# Patient Record
Sex: Female | Born: 1995 | Hispanic: No | Marital: Single | Smoking: Never smoker
Health system: Southern US, Community
[De-identification: ages and names within clinical notes are randomized; demographics above are authoritative.]

## PROBLEM LIST (undated history)

## (undated) DIAGNOSIS — B20 Human immunodeficiency virus [HIV] disease: Secondary | ICD-10-CM

## (undated) DIAGNOSIS — Z21 Asymptomatic human immunodeficiency virus [HIV] infection status: Secondary | ICD-10-CM

## (undated) HISTORY — DX: Human immunodeficiency virus (HIV) disease: B20

## (undated) HISTORY — PX: WISDOM TOOTH EXTRACTION: SHX21

## (undated) HISTORY — DX: Asymptomatic human immunodeficiency virus (hiv) infection status: Z21

---

## 2014-05-18 ENCOUNTER — Telehealth: Payer: Self-pay

## 2014-05-18 NOTE — Telephone Encounter (Signed)
Patient referred through [facility].  Case Manager Vena AustriaEleanor with [employer].  I have not been able to contact Vena AustriaEleanor to schedule patient's visit.  I spoke with Laurel DimmerKim H., Social Worker with [facility] who schedule appointment and will try to contact Case Manager with appointment information.  Pt does not speak [place]. She speaks Swahili.   Laurell Josephsammy K King, RN

## 2014-05-20 ENCOUNTER — Other Ambulatory Visit: Payer: Self-pay | Admitting: Infectious Disease

## 2014-05-20 ENCOUNTER — Ambulatory Visit
Admission: RE | Admit: 2014-05-20 | Discharge: 2014-05-20 | Disposition: A | Discharge status: Home / Self Care | Payer: No Typology Code available for payment source | Source: Ambulatory Visit | Attending: Infectious Disease | Admitting: Infectious Disease

## 2014-05-20 ENCOUNTER — Other Ambulatory Visit (HOSPITAL_COMMUNITY)
Admission: RE | Admit: 2014-05-20 | Discharge: 2014-05-20 | Disposition: A | Discharge status: Home / Self Care | Payer: Medicaid Other | Source: Ambulatory Visit | Attending: Internal Medicine | Admitting: Internal Medicine

## 2014-05-20 ENCOUNTER — Ambulatory Visit (INDEPENDENT_AMBULATORY_CARE_PROVIDER_SITE_OTHER): Payer: Medicaid Other

## 2014-05-20 ENCOUNTER — Encounter: Payer: Self-pay | Admitting: *Deleted

## 2014-05-20 DIAGNOSIS — Z139 Encounter for screening, unspecified: Secondary | ICD-10-CM

## 2014-05-20 DIAGNOSIS — Z113 Encounter for screening for infections with a predominantly sexual mode of transmission: Secondary | ICD-10-CM | POA: Insufficient documentation

## 2014-05-20 DIAGNOSIS — B20 Human immunodeficiency virus [HIV] disease: Secondary | ICD-10-CM

## 2014-05-20 DIAGNOSIS — Z23 Encounter for immunization: Secondary | ICD-10-CM | POA: Diagnosis present

## 2014-05-20 DIAGNOSIS — Z91410 Personal history of adult physical and sexual abuse: Secondary | ICD-10-CM

## 2014-05-20 MED ORDER — EMTRICITAB-RILPIVIR-TENOFOV DF 200-25-300 MG PO TABS: 1.0000 | ORAL_TABLET | Freq: Every day | ORAL | Status: DC

## 2014-05-20 MED ORDER — SULFAMETHOXAZOLE-TRIMETHOPRIM 800-160 MG PO TABS: 1.0000 | ORAL_TABLET | Freq: Every day | ORAL | Status: DC

## 2014-05-21 LAB — COMPLETE METABOLIC PANEL WITH GFR
ALBUMIN: 3.6 g/dL (ref 3.5–5.2)
ALT: 39 U/L — ABNORMAL HIGH (ref 0–35)
AST: 66 U/L — ABNORMAL HIGH (ref 0–37)
Alkaline Phosphatase: 84 U/L (ref 39–117)
BUN: 9 mg/dL (ref 6–23)
CO2: 26 mEq/L (ref 19–32)
CREATININE: 0.57 mg/dL (ref 0.50–1.10)
Calcium: 8.9 mg/dL (ref 8.4–10.5)
Chloride: 103 mEq/L (ref 96–112)
GFR, Est Non African American: >89 mL/min
Glucose, Bld: 51 mg/dL — ABNORMAL LOW (ref 70–99)
POTASSIUM: 3.4 meq/L — AB (ref 3.5–5.3)
Sodium: 137 mEq/L (ref 135–145)
Total Bilirubin: 0.2 mg/dL (ref 0.2–1.2)
Total Protein: 8.3 g/dL (ref 6.0–8.3)

## 2014-05-21 LAB — CBC WITH DIFFERENTIAL/PLATELET
Basophils Absolute: 0 10*3/uL (ref 0.0–0.1)
Basophils Relative: 1 % (ref 0–1)
EOS ABS: 0.1 10*3/uL (ref 0.0–0.7)
Eosinophils Relative: 5 % (ref 0–5)
HEMATOCRIT: 33.1 % — AB (ref 36.0–46.0)
Hemoglobin: 10.6 g/dL — ABNORMAL LOW (ref 12.0–15.0)
LYMPHS PCT: 38 % (ref 12–46)
Lymphs Abs: 0.9 10*3/uL (ref 0.7–4.0)
MCH: 29.2 pg (ref 26.0–34.0)
MCHC: 32 g/dL (ref 30.0–36.0)
MCV: 91.2 fL (ref 78.0–100.0)
MONO ABS: 0.2 10*3/uL (ref 0.1–1.0)
MONOS PCT: 8 % (ref 3–12)
MPV: 11.2 fL (ref 8.6–12.4)
Neutro Abs: 1.1 10*3/uL — ABNORMAL LOW (ref 1.7–7.7)
Neutrophils Relative %: 48 % (ref 43–77)
Platelets: 247 10*3/uL (ref 150–400)
RBC: 3.63 MIL/uL — ABNORMAL LOW (ref 3.87–5.11)
RDW: 15.7 % — ABNORMAL HIGH (ref 11.5–15.5)
WBC: 2.3 10*3/uL — ABNORMAL LOW (ref 4.0–10.5)

## 2014-05-21 LAB — LIPID PANEL
CHOL/HDL RATIO: 4.1 ratio
CHOLESTEROL: 136 mg/dL (ref 0–200)
HDL: 33 mg/dL — ABNORMAL LOW (ref >=46)
LDL CALC: 78 mg/dL (ref 0–99)
Triglycerides: 126 mg/dL (ref <150)
VLDL: 25 mg/dL (ref 0–40)

## 2014-05-21 LAB — URINALYSIS
BILIRUBIN URINE: NEGATIVE
Glucose, UA: NEGATIVE mg/dL
KETONES UR: NEGATIVE mg/dL
Leukocytes, UA: NEGATIVE
Nitrite: NEGATIVE
Protein, ur: NEGATIVE mg/dL
Specific Gravity, Urine: 1.009 (ref 1.005–1.030)
Urobilinogen, UA: 0.2 mg/dL (ref 0.0–1.0)
pH: 6 (ref 5.0–8.0)

## 2014-05-21 LAB — HEPATITIS B SURFACE ANTIBODY,QUALITATIVE: Hep B S Ab: POSITIVE — AB

## 2014-05-21 LAB — HEPATITIS B SURFACE ANTIGEN: HEP B S AG: NEGATIVE

## 2014-05-21 LAB — T-HELPER CELL (CD4) - (RCID CLINIC ONLY)
CD4 % Helper T Cell: 18 % — ABNORMAL LOW (ref 33–55)
CD4 T Cell Abs: 160 /uL — ABNORMAL LOW (ref 400–2700)

## 2014-05-21 LAB — HEPATITIS C ANTIBODY: HCV AB: NEGATIVE

## 2014-05-21 LAB — RPR

## 2014-05-21 LAB — HEPATITIS A ANTIBODY, TOTAL: Hep A Total Ab: REACTIVE — AB

## 2014-05-21 LAB — HEPATITIS B CORE ANTIBODY, TOTAL: Hep B Core Total Ab: NONREACTIVE

## 2014-05-21 LAB — URINE CYTOLOGY ANCILLARY ONLY
Chlamydia: NEGATIVE
Neisseria Gonorrhea: NEGATIVE

## 2014-05-22 LAB — QUANTIFERON TB GOLD ASSAY (BLOOD)
INTERFERON GAMMA RELEASE ASSAY: NEGATIVE
Mitogen value: 1.46 IU/mL
QUANTIFERON TB AG MINUS NIL: 0 [IU]/mL
Quantiferon Nil Value: 0.16 IU/mL
TB Ag value: 0.15 IU/mL

## 2014-05-22 LAB — HIV-1 RNA QUANT-NO REFLEX-BLD
HIV 1 RNA Quant: 73039 copies/mL — ABNORMAL HIGH (ref <20)
HIV-1 RNA Quant, Log: 4.86 {Log} — ABNORMAL HIGH (ref <1.30)

## 2014-05-25 LAB — HLA B*5701: HLA-B*5701 w/rflx HLA-B High: NEGATIVE

## 2014-05-27 DIAGNOSIS — Z91410 Personal history of adult physical and sexual abuse: Secondary | ICD-10-CM | POA: Insufficient documentation
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
## 2014-06-11 ENCOUNTER — Encounter: Payer: Self-pay | Admitting: Internal Medicine

## 2014-06-11 ENCOUNTER — Ambulatory Visit (INDEPENDENT_AMBULATORY_CARE_PROVIDER_SITE_OTHER): Payer: Medicaid Other | Admitting: Internal Medicine

## 2014-06-11 VITALS — BP 105/70 | HR 82 | Temp 97.8°F | Wt 142.0 lb

## 2014-06-11 DIAGNOSIS — B20 Human immunodeficiency virus [HIV] disease: Secondary | ICD-10-CM | POA: Diagnosis not present

## 2014-06-11 DIAGNOSIS — R21 Rash and other nonspecific skin eruption: Secondary | ICD-10-CM | POA: Diagnosis not present

## 2014-06-11 MED ORDER — SULFAMETHOXAZOLE-TRIMETHOPRIM 800-160 MG PO TABS: 1.0000 | ORAL_TABLET | Freq: Every day | ORAL | Status: DC

## 2014-06-11 MED ORDER — EMTRICITAB-RILPIVIR-TENOFOV DF 200-25-300 MG PO TABS: 1.0000 | ORAL_TABLET | Freq: Every day | ORAL | Status: DC

## 2014-06-11 MED ORDER — CLOTRIMAZOLE 1 % EX CREA: 1.0000 "application " | TOPICAL_CREAM | Freq: Two times a day (BID) | CUTANEOUS | Status: DC
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
## 2014-06-13 LAB — HIV-1 RNA ULTRAQUANT REFLEX TO GENTYP+
HIV 1 RNA Quant: 165496 copies/mL — ABNORMAL HIGH (ref <20)
HIV-1 RNA Quant, Log: 5.22 {Log} — ABNORMAL HIGH (ref <1.30)

## 2014-06-23 LAB — HIV-1 GENOTYPR PLUS

## 2014-07-15 ENCOUNTER — Ambulatory Visit: Payer: Medicaid Other | Admitting: Internal Medicine

## 2014-07-16 ENCOUNTER — Ambulatory Visit (INDEPENDENT_AMBULATORY_CARE_PROVIDER_SITE_OTHER): Payer: Medicaid Other | Admitting: Internal Medicine

## 2014-07-16 VITALS — Temp 98.5°F | Wt 145.5 lb

## 2014-07-16 DIAGNOSIS — Z91199 Patient's noncompliance with other medical treatment and regimen due to unspecified reason: Secondary | ICD-10-CM

## 2014-07-16 DIAGNOSIS — Z9119 Patient's noncompliance with other medical treatment and regimen: Secondary | ICD-10-CM

## 2014-07-16 DIAGNOSIS — B20 Human immunodeficiency virus [HIV] disease: Secondary | ICD-10-CM | POA: Diagnosis present

## 2014-07-16 DIAGNOSIS — R110 Nausea: Secondary | ICD-10-CM | POA: Diagnosis not present

## 2014-07-16 DIAGNOSIS — R21 Rash and other nonspecific skin eruption: Secondary | ICD-10-CM | POA: Diagnosis not present

## 2014-07-16 MED ORDER — ONDANSETRON 4 MG PO TBDP: 4.0000 mg | ORAL_TABLET | Freq: Three times a day (TID) | ORAL | Status: DC | PRN
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
## 2014-07-17 LAB — HIV-1 RNA QUANT-NO REFLEX-BLD
HIV 1 RNA Quant: 2116 copies/mL — ABNORMAL HIGH (ref <20)
HIV-1 RNA Quant, Log: 3.33 {Log} — ABNORMAL HIGH (ref <1.30)

## 2014-07-17 LAB — T-HELPER CELL (CD4) - (RCID CLINIC ONLY)
CD4 T CELL HELPER: 19 % — AB (ref 33–55)
CD4 T Cell Abs: 230 /uL — ABNORMAL LOW (ref 400–2700)

## 2014-08-27 ENCOUNTER — Ambulatory Visit (INDEPENDENT_AMBULATORY_CARE_PROVIDER_SITE_OTHER): Payer: Medicaid Other | Admitting: Internal Medicine

## 2014-08-27 ENCOUNTER — Encounter: Payer: Self-pay | Admitting: Internal Medicine

## 2014-08-27 VITALS — BP 98/64 | HR 76 | Temp 98.4°F | Wt 142.0 lb

## 2014-08-27 DIAGNOSIS — B373 Candidiasis of vulva and vagina: Secondary | ICD-10-CM

## 2014-08-27 DIAGNOSIS — B86 Scabies: Secondary | ICD-10-CM

## 2014-08-27 DIAGNOSIS — B20 Human immunodeficiency virus [HIV] disease: Secondary | ICD-10-CM | POA: Diagnosis not present

## 2014-08-27 DIAGNOSIS — B3731 Acute candidiasis of vulva and vagina: Secondary | ICD-10-CM

## 2014-08-27 MED ORDER — FLUCONAZOLE 150 MG PO TABS: 150.0000 mg | ORAL_TABLET | Freq: Every day | ORAL | Status: DC

## 2014-08-27 MED ORDER — PERMETHRIN 5 % EX CREA: 1.0000 "application " | TOPICAL_CREAM | Freq: Once | CUTANEOUS | Status: DC
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
## 2014-09-10 ENCOUNTER — Other Ambulatory Visit: Payer: Medicaid Other

## 2014-09-10 DIAGNOSIS — B20 Human immunodeficiency virus [HIV] disease: Secondary | ICD-10-CM

## 2014-09-10 LAB — BASIC METABOLIC PANEL
BUN: 6 mg/dL (ref 6–23)
CHLORIDE: 106 meq/L (ref 96–112)
CO2: 25 mEq/L (ref 19–32)
CREATININE: 0.68 mg/dL (ref 0.50–1.10)
Calcium: 8.9 mg/dL (ref 8.4–10.5)
GLUCOSE: 75 mg/dL (ref 70–99)
Potassium: 4.2 mEq/L (ref 3.5–5.3)
Sodium: 139 mEq/L (ref 135–145)

## 2014-09-11 LAB — CBC WITH DIFFERENTIAL/PLATELET
Basophils Absolute: 0 10*3/uL (ref 0.0–0.1)
Basophils Relative: 0 % (ref 0–1)
Eosinophils Absolute: 0.3 10*3/uL (ref 0.0–0.7)
Eosinophils Relative: 10 % — ABNORMAL HIGH (ref 0–5)
HEMATOCRIT: 32.6 % — AB (ref 36.0–46.0)
Hemoglobin: 10.4 g/dL — ABNORMAL LOW (ref 12.0–15.0)
Lymphocytes Relative: 58 % — ABNORMAL HIGH (ref 12–46)
Lymphs Abs: 1.5 10*3/uL (ref 0.7–4.0)
MCH: 29.1 pg (ref 26.0–34.0)
MCHC: 31.9 g/dL (ref 30.0–36.0)
MCV: 91.3 fL (ref 78.0–100.0)
MONOS PCT: 9 % (ref 3–12)
MPV: 10.8 fL (ref 8.6–12.4)
Monocytes Absolute: 0.2 10*3/uL (ref 0.1–1.0)
Neutro Abs: 0.6 10*3/uL — ABNORMAL LOW (ref 1.7–7.7)
Neutrophils Relative %: 23 % — ABNORMAL LOW (ref 43–77)
Platelets: 321 10*3/uL (ref 150–400)
RBC: 3.57 MIL/uL — ABNORMAL LOW (ref 3.87–5.11)
RDW: 14.7 % (ref 11.5–15.5)
WBC: 2.6 10*3/uL — ABNORMAL LOW (ref 4.0–10.5)

## 2014-09-11 LAB — HIV-1 RNA QUANT-NO REFLEX-BLD
HIV 1 RNA QUANT: 169 {copies}/mL — AB (ref <20)
HIV-1 RNA QUANT, LOG: 2.23 {Log} — AB (ref <1.30)

## 2014-09-11 LAB — PATHOLOGIST SMEAR REVIEW

## 2014-10-13 ENCOUNTER — Other Ambulatory Visit: Payer: Medicaid Other

## 2014-11-03 ENCOUNTER — Encounter: Payer: Self-pay | Admitting: Internal Medicine

## 2014-11-03 ENCOUNTER — Ambulatory Visit (INDEPENDENT_AMBULATORY_CARE_PROVIDER_SITE_OTHER): Payer: Medicaid Other | Admitting: Internal Medicine

## 2014-11-03 VITALS — BP 111/74 | HR 75 | Temp 98.2°F | Ht 64.0 in | Wt 146.0 lb

## 2014-11-03 DIAGNOSIS — Z23 Encounter for immunization: Secondary | ICD-10-CM | POA: Diagnosis present

## 2014-11-03 DIAGNOSIS — B354 Tinea corporis: Secondary | ICD-10-CM | POA: Diagnosis not present

## 2014-11-03 DIAGNOSIS — B20 Human immunodeficiency virus [HIV] disease: Secondary | ICD-10-CM | POA: Diagnosis not present

## 2014-11-03 MED ORDER — MICONAZOLE NITRATE 2 % EX CREA: 1.0000 "application " | TOPICAL_CREAM | Freq: Two times a day (BID) | CUTANEOUS | Status: DC
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
## 2014-11-04 LAB — HIV-1 RNA QUANT-NO REFLEX-BLD
HIV 1 RNA Quant: 47 copies/mL — ABNORMAL HIGH (ref <20)
HIV-1 RNA Quant, Log: 1.67 {Log} — ABNORMAL HIGH (ref <1.30)

## 2014-11-05 LAB — T-HELPER CELL (CD4) - (RCID CLINIC ONLY)
CD4 T CELL ABS: 410 /uL (ref 400–2700)
CD4 T CELL HELPER: 23 % — AB (ref 33–55)

## 2014-11-17 ENCOUNTER — Other Ambulatory Visit: Payer: Self-pay | Admitting: Internal Medicine

## 2014-11-20 ENCOUNTER — Telehealth: Payer: Self-pay | Admitting: *Deleted

## 2014-11-20 NOTE — Telephone Encounter (Signed)
If she feels that it worked for her, can give her refill. It is tx for scabies, i would review with her how to put it on at night, keep on 8hrs, wash off in the morning. Important that she does hot washing of sheets, vacuum of furniture, between cushions since i wonder if she is getting reinfected

## 2014-11-20 NOTE — Telephone Encounter (Signed)
Pharmacy request for permethrin 5% cream. Last refilled in June 2016. Unsure of payer source, is this ok to refill? Wendall Mola

## 2014-11-23 ENCOUNTER — Other Ambulatory Visit: Payer: Self-pay | Admitting: *Deleted

## 2014-11-23 DIAGNOSIS — B20 Human immunodeficiency virus [HIV] disease: Secondary | ICD-10-CM

## 2014-11-23 MED ORDER — EMTRICITAB-RILPIVIR-TENOFOV DF 200-25-300 MG PO TABS: 1.0000 | ORAL_TABLET | Freq: Every day | ORAL | Status: DC

## 2014-11-24 ENCOUNTER — Other Ambulatory Visit: Payer: Self-pay | Admitting: *Deleted

## 2014-11-24 DIAGNOSIS — B86 Scabies: Secondary | ICD-10-CM

## 2014-11-24 MED ORDER — PERMETHRIN 5 % EX CREA: 1.0000 "application " | TOPICAL_CREAM | Freq: Once | CUTANEOUS | Status: DC

## 2014-11-24 NOTE — Telephone Encounter (Signed)
Triage nurse please take care of this. Thank you. Jacqueline Cockerham  

## 2015-01-04 DIAGNOSIS — Z23 Encounter for immunization: Secondary | ICD-10-CM

## 2015-01-04 NOTE — Congregational Nurse Program (Signed)
[facility].  She wants to get flu vaccine.  Has no insurance, or medicaid.  Client [place].  She signed up for flu vaccine to be given on Thursday.  Will f/u with client on Thursday to give flu vaccine

## 2015-01-19 ENCOUNTER — Other Ambulatory Visit: Payer: Self-pay | Admitting: *Deleted

## 2015-01-19 DIAGNOSIS — B20 Human immunodeficiency virus [HIV] disease: Secondary | ICD-10-CM

## 2015-01-19 MED ORDER — EMTRICITAB-RILPIVIR-TENOFOV DF 200-25-300 MG PO TABS: 1.0000 | ORAL_TABLET | Freq: Every day | ORAL | Status: DC

## 2015-02-11 ENCOUNTER — Ambulatory Visit: Payer: Medicaid Other | Admitting: Internal Medicine

## 2015-05-10 ENCOUNTER — Other Ambulatory Visit (INDEPENDENT_AMBULATORY_CARE_PROVIDER_SITE_OTHER): Payer: Self-pay

## 2015-05-10 DIAGNOSIS — Z113 Encounter for screening for infections with a predominantly sexual mode of transmission: Secondary | ICD-10-CM

## 2015-05-10 DIAGNOSIS — B20 Human immunodeficiency virus [HIV] disease: Secondary | ICD-10-CM

## 2015-05-10 DIAGNOSIS — Z79899 Other long term (current) drug therapy: Secondary | ICD-10-CM

## 2015-05-10 LAB — LIPID PANEL
CHOL/HDL RATIO: 4.9 ratio (ref <=5.0)
Cholesterol: 133 mg/dL (ref 125–200)
HDL: 27 mg/dL — AB (ref >=46)
LDL CALC: 76 mg/dL (ref <130)
TRIGLYCERIDES: 151 mg/dL — AB (ref <150)
VLDL: 30 mg/dL (ref <30)

## 2015-05-10 LAB — CBC WITH DIFFERENTIAL/PLATELET
BASOS PCT: 1 % (ref 0–1)
Basophils Absolute: 0 10*3/uL (ref 0.0–0.1)
EOS ABS: 0.1 10*3/uL (ref 0.0–0.7)
EOS PCT: 4 % (ref 0–5)
HCT: 33.5 % — ABNORMAL LOW (ref 36.0–46.0)
Hemoglobin: 10.8 g/dL — ABNORMAL LOW (ref 12.0–15.0)
LYMPHS ABS: 1.5 10*3/uL (ref 0.7–4.0)
Lymphocytes Relative: 41 % (ref 12–46)
MCH: 29.3 pg (ref 26.0–34.0)
MCHC: 32.2 g/dL (ref 30.0–36.0)
MCV: 90.8 fL (ref 78.0–100.0)
MONO ABS: 0.3 10*3/uL (ref 0.1–1.0)
MONOS PCT: 8 % (ref 3–12)
MPV: 10.6 fL (ref 8.6–12.4)
Neutro Abs: 1.7 10*3/uL (ref 1.7–7.7)
Neutrophils Relative %: 46 % (ref 43–77)
PLATELETS: 390 10*3/uL (ref 150–400)
RBC: 3.69 MIL/uL — ABNORMAL LOW (ref 3.87–5.11)
RDW: 13.6 % (ref 11.5–15.5)
WBC: 3.6 10*3/uL — ABNORMAL LOW (ref 4.0–10.5)

## 2015-05-10 LAB — COMPLETE METABOLIC PANEL WITH GFR
ALK PHOS: 113 U/L (ref 33–115)
ALT: 25 U/L (ref 6–29)
AST: 34 U/L — ABNORMAL HIGH (ref 10–30)
Albumin: 3.9 g/dL (ref 3.6–5.1)
BILIRUBIN TOTAL: 0.3 mg/dL (ref 0.2–1.2)
BUN: 6 mg/dL — AB (ref 7–25)
CALCIUM: 9.5 mg/dL (ref 8.6–10.2)
CHLORIDE: 104 mmol/L (ref 98–110)
CO2: 23 mmol/L (ref 20–31)
CREATININE: 0.65 mg/dL (ref 0.50–1.10)
Glucose, Bld: 69 mg/dL (ref 65–99)
Potassium: 4.5 mmol/L (ref 3.5–5.3)
Sodium: 138 mmol/L (ref 135–146)
Total Protein: 7.8 g/dL (ref 6.1–8.1)

## 2015-05-11 LAB — RPR

## 2015-05-11 LAB — HIV-1 RNA QUANT-NO REFLEX-BLD
HIV 1 RNA QUANT: 168 {copies}/mL — AB (ref <20)
HIV-1 RNA QUANT, LOG: 2.23 {Log_copies}/mL — AB (ref <1.30)

## 2015-05-11 LAB — T-HELPER CELL (CD4) - (RCID CLINIC ONLY)
CD4 % Helper T Cell: 28 % — ABNORMAL LOW (ref 33–55)
CD4 T Cell Abs: 450 /uL (ref 400–2700)

## 2015-05-11 LAB — URINE CYTOLOGY ANCILLARY ONLY
Chlamydia: NEGATIVE
Neisseria Gonorrhea: NEGATIVE

## 2015-06-01 ENCOUNTER — Encounter: Payer: Self-pay | Admitting: Internal Medicine

## 2015-06-01 ENCOUNTER — Telehealth: Payer: Self-pay

## 2015-06-01 ENCOUNTER — Ambulatory Visit (INDEPENDENT_AMBULATORY_CARE_PROVIDER_SITE_OTHER): Payer: Self-pay | Admitting: Internal Medicine

## 2015-06-01 VITALS — BP 100/69 | HR 83 | Temp 98.1°F | Wt 149.0 lb

## 2015-06-01 DIAGNOSIS — B20 Human immunodeficiency virus [HIV] disease: Secondary | ICD-10-CM

## 2015-06-01 MED ORDER — ELVITEG-COBIC-EMTRICIT-TENOFAF 150-150-200-10 MG PO TABS: 1.0000 | ORAL_TABLET | Freq: Every day | ORAL | Status: DC

## 2015-06-01 NOTE — Telephone Encounter (Signed)
[facility] and notified her that Dr. Drue SecondSnider wants the Complera discontinued. Tammy stated that she would take care of it.
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
## 2015-07-04 IMAGING — CR DG CHEST 1V
1 series · 1 of 1 positions shown · non-contrast
Comparison: None

CLINICAL DATA: Class B refugee, screening

EXAM:
CHEST  1 VIEW

[w chest pa]
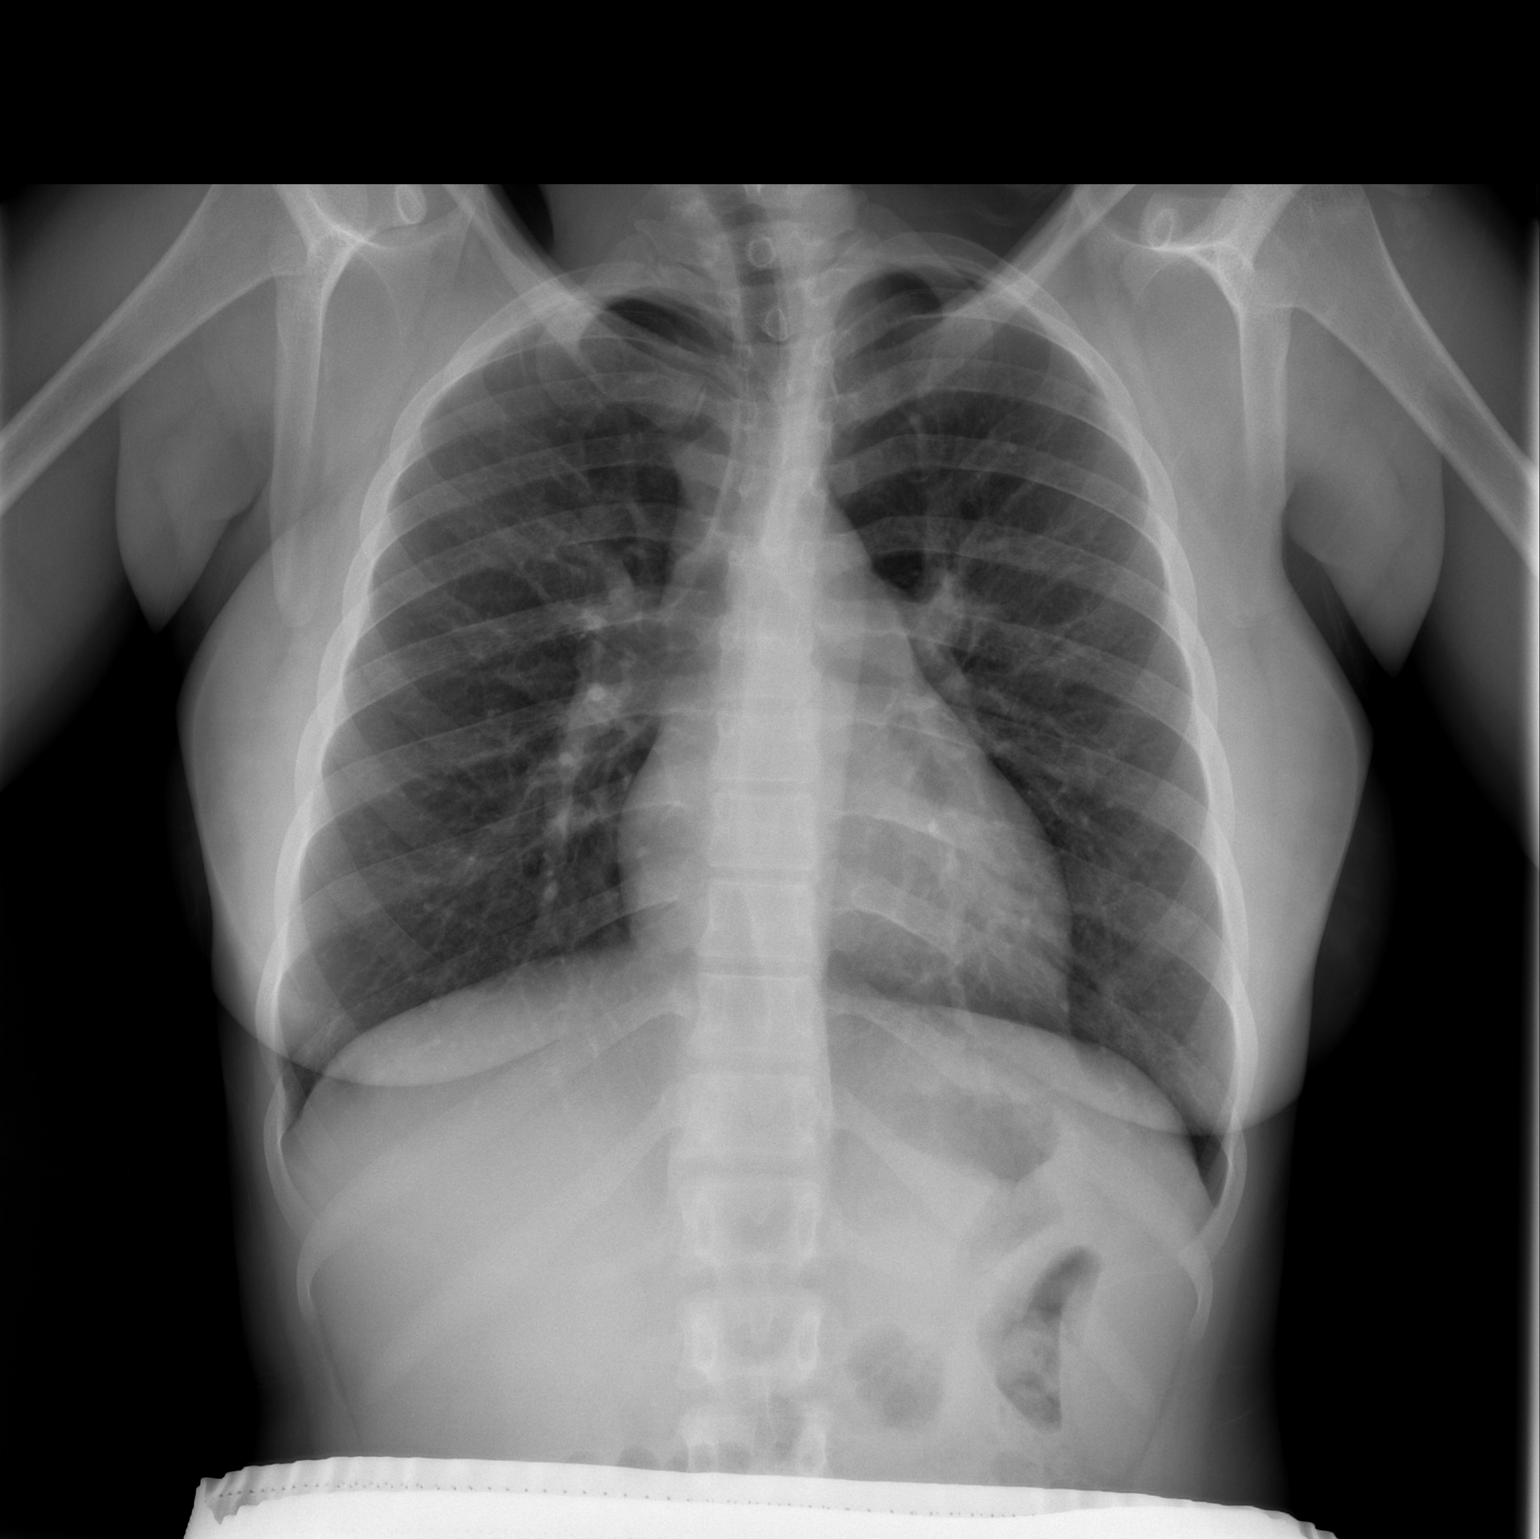

[1 of 1 positions shown; findings below may reference images not displayed]

FINDINGS: Normal heart size, mediastinal contours, and pulmonary vascularity.

Lungs clear.

No pneumothorax.

Bones unremarkable.
IMPRESSION: Normal exam.

## 2015-07-20 ENCOUNTER — Ambulatory Visit: Payer: Self-pay | Admitting: Internal Medicine

## 2015-09-23 ENCOUNTER — Ambulatory Visit (INDEPENDENT_AMBULATORY_CARE_PROVIDER_SITE_OTHER): Payer: BLUE CROSS/BLUE SHIELD | Admitting: Internal Medicine

## 2015-09-23 ENCOUNTER — Ambulatory Visit: Payer: Self-pay | Admitting: *Deleted

## 2015-09-23 ENCOUNTER — Encounter: Payer: Self-pay | Admitting: Internal Medicine

## 2015-09-23 VITALS — BP 111/74 | HR 75 | Temp 97.4°F | Wt 152.0 lb

## 2015-09-23 DIAGNOSIS — Z113 Encounter for screening for infections with a predominantly sexual mode of transmission: Secondary | ICD-10-CM | POA: Diagnosis not present

## 2015-09-23 DIAGNOSIS — Z124 Encounter for screening for malignant neoplasm of cervix: Secondary | ICD-10-CM

## 2015-09-23 DIAGNOSIS — H6123 Impacted cerumen, bilateral: Secondary | ICD-10-CM | POA: Diagnosis not present

## 2015-09-23 DIAGNOSIS — N898 Other specified noninflammatory disorders of vagina: Secondary | ICD-10-CM

## 2015-09-23 DIAGNOSIS — B20 Human immunodeficiency virus [HIV] disease: Secondary | ICD-10-CM | POA: Diagnosis not present

## 2015-09-23 DIAGNOSIS — Z91410 Personal history of adult physical and sexual abuse: Secondary | ICD-10-CM

## 2015-09-23 LAB — CBC WITH DIFFERENTIAL/PLATELET
Basophils Absolute: 0 cells/uL (ref 0–200)
Basophils Relative: 0 %
EOS ABS: 217 {cells}/uL (ref 15–500)
Eosinophils Relative: 7 %
HCT: 31.9 % — ABNORMAL LOW (ref 35.0–45.0)
Hemoglobin: 10.3 g/dL — ABNORMAL LOW (ref 11.7–15.5)
LYMPHS ABS: 1798 {cells}/uL (ref 850–3900)
LYMPHS PCT: 58 %
MCH: 29.9 pg (ref 27.0–33.0)
MCHC: 32.3 g/dL (ref 32.0–36.0)
MCV: 92.7 fL (ref 80.0–100.0)
MONOS PCT: 12 %
MPV: 11.2 fL (ref 7.5–12.5)
Monocytes Absolute: 372 cells/uL (ref 200–950)
Neutro Abs: 713 cells/uL — ABNORMAL LOW (ref 1500–7800)
Neutrophils Relative %: 23 %
PLATELETS: 246 10*3/uL (ref 140–400)
RBC: 3.44 MIL/uL — AB (ref 3.80–5.10)
RDW: 15 % (ref 11.0–15.0)
WBC: 3.1 10*3/uL — AB (ref 3.8–10.8)

## 2015-09-23 LAB — BASIC METABOLIC PANEL
BUN: 7 mg/dL (ref 7–25)
CHLORIDE: 107 mmol/L (ref 98–110)
CO2: 22 mmol/L (ref 20–31)
CREATININE: 0.75 mg/dL (ref 0.50–1.10)
Calcium: 8.6 mg/dL (ref 8.6–10.2)
Glucose, Bld: 74 mg/dL (ref 65–99)
Potassium: 4.1 mmol/L (ref 3.5–5.3)
Sodium: 137 mmol/L (ref 135–146)

## 2015-09-23 MED ORDER — FLUCONAZOLE 150 MG PO TABS: 150.0000 mg | ORAL_TABLET | Freq: Every day | ORAL | Status: DC

## 2015-09-23 NOTE — BH Specialist Note (Signed)
Counselor met with Sheena Wallace in the exam room for a warm hand off.  Patient was oriented times four with good affect and dress.  Patient was alert and talkative.  Patient was soft spoken but most honest about what was going on in her life that was causing her grief.  Counselor made necessary introductions and shared with her that counseling is available if she needs it.  Counselor provided support and encouragement for patient by reassuring her that processing it was important to resolving those very  Issues.  Patient indicated that she would like to set up an appointment.  Counselor explained that she could make an appointment when she checked out today.   [name], MA, LPC [facility]
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
## 2015-09-24 LAB — PATHOLOGIST SMEAR REVIEW

## 2015-09-24 LAB — CERVICOVAGINAL ANCILLARY ONLY
CHLAMYDIA, DNA PROBE: NEGATIVE
NEISSERIA GONORRHEA: NEGATIVE
TRICH (WINDOWPATH): NEGATIVE

## 2015-09-24 LAB — CYTOLOGY - PAP

## 2015-09-24 LAB — T-HELPER CELL (CD4) - (RCID CLINIC ONLY)
CD4 % Helper T Cell: 25 % — ABNORMAL LOW (ref 33–55)
CD4 T Cell Abs: 470 /uL (ref 400–2700)

## 2015-09-27 LAB — HIV-1 RNA QUANT-NO REFLEX-BLD: HIV 1 RNA Quant: <20 copies/mL (ref <20)

## 2015-09-27 LAB — CERVICOVAGINAL ANCILLARY ONLY
BACTERIAL VAGINITIS: NEGATIVE
CANDIDA VAGINITIS: POSITIVE — AB

## 2015-10-25 ENCOUNTER — Ambulatory Visit: Payer: Self-pay | Admitting: *Deleted

## 2015-12-03 ENCOUNTER — Encounter: Payer: Self-pay | Admitting: *Deleted

## 2016-01-06 ENCOUNTER — Ambulatory Visit (INDEPENDENT_AMBULATORY_CARE_PROVIDER_SITE_OTHER): Payer: BLUE CROSS/BLUE SHIELD | Admitting: Internal Medicine

## 2016-01-06 ENCOUNTER — Encounter: Payer: Self-pay | Admitting: Internal Medicine

## 2016-01-06 VITALS — BP 105/69 | HR 79 | Temp 97.5°F | Wt 167.0 lb

## 2016-01-06 DIAGNOSIS — B20 Human immunodeficiency virus [HIV] disease: Secondary | ICD-10-CM | POA: Diagnosis not present

## 2016-01-06 DIAGNOSIS — B9789 Other viral agents as the cause of diseases classified elsewhere: Secondary | ICD-10-CM | POA: Diagnosis not present

## 2016-01-06 DIAGNOSIS — J069 Acute upper respiratory infection, unspecified: Secondary | ICD-10-CM | POA: Diagnosis not present
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
## 2016-03-28 ENCOUNTER — Encounter: Payer: Self-pay | Admitting: Internal Medicine

## 2016-03-28 ENCOUNTER — Telehealth: Payer: Self-pay | Admitting: *Deleted

## 2016-03-28 ENCOUNTER — Ambulatory Visit (INDEPENDENT_AMBULATORY_CARE_PROVIDER_SITE_OTHER): Payer: BLUE CROSS/BLUE SHIELD | Admitting: Internal Medicine

## 2016-03-28 VITALS — BP 104/68 | HR 76 | Temp 98.5°F | Ht 65.0 in | Wt 156.4 lb

## 2016-03-28 DIAGNOSIS — R300 Dysuria: Secondary | ICD-10-CM

## 2016-03-28 DIAGNOSIS — Z23 Encounter for immunization: Secondary | ICD-10-CM

## 2016-03-28 DIAGNOSIS — B20 Human immunodeficiency virus [HIV] disease: Secondary | ICD-10-CM

## 2016-03-28 LAB — COMPLETE METABOLIC PANEL WITH GFR
ALT: 7 U/L (ref 6–29)
AST: 17 U/L (ref 10–30)
Albumin: 4.1 g/dL (ref 3.6–5.1)
Alkaline Phosphatase: 97 U/L (ref 33–115)
BILIRUBIN TOTAL: 0.4 mg/dL (ref 0.2–1.2)
BUN: 8 mg/dL (ref 7–25)
CALCIUM: 9.3 mg/dL (ref 8.6–10.2)
CHLORIDE: 104 mmol/L (ref 98–110)
CO2: 23 mmol/L (ref 20–31)
CREATININE: 0.77 mg/dL (ref 0.50–1.10)
GFR, Est Non African American: >89 mL/min (ref >=60)
Glucose, Bld: 78 mg/dL (ref 65–99)
Potassium: 4 mmol/L (ref 3.5–5.3)
Sodium: 137 mmol/L (ref 135–146)
TOTAL PROTEIN: 7.8 g/dL (ref 6.1–8.1)

## 2016-03-28 LAB — CBC WITH DIFFERENTIAL/PLATELET
BASOS PCT: 1 %
Basophils Absolute: 29 cells/uL (ref 0–200)
EOS ABS: 290 {cells}/uL (ref 15–500)
Eosinophils Relative: 10 %
HEMATOCRIT: 37.1 % (ref 35.0–45.0)
HEMOGLOBIN: 12 g/dL (ref 11.7–15.5)
LYMPHS ABS: 1798 {cells}/uL (ref 850–3900)
LYMPHS PCT: 62 %
MCH: 31.5 pg (ref 27.0–33.0)
MCHC: 32.3 g/dL (ref 32.0–36.0)
MCV: 97.4 fL (ref 80.0–100.0)
MONO ABS: 232 {cells}/uL (ref 200–950)
MPV: 11.8 fL (ref 7.5–12.5)
Monocytes Relative: 8 %
Neutro Abs: 551 cells/uL — ABNORMAL LOW (ref 1500–7800)
Neutrophils Relative %: 19 %
Platelets: 259 10*3/uL (ref 140–400)
RBC: 3.81 MIL/uL (ref 3.80–5.10)
RDW: 13.7 % (ref 11.0–15.0)
WBC: 2.9 10*3/uL — AB (ref 3.8–10.8)
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
## 2016-03-28 NOTE — Telephone Encounter (Signed)
Called the patient to advise she left her wallet at the clinic.

## 2016-03-29 LAB — URINALYSIS, ROUTINE W REFLEX MICROSCOPIC
BILIRUBIN URINE: NEGATIVE
Glucose, UA: NEGATIVE
Hgb urine dipstick: NEGATIVE
Leukocytes, UA: NEGATIVE
Nitrite: NEGATIVE
Protein, ur: NEGATIVE
SPECIFIC GRAVITY, URINE: 1.025 (ref 1.001–1.035)
pH: 5.5 (ref 5.0–8.0)

## 2016-03-29 LAB — T-HELPER CELL (CD4) - (RCID CLINIC ONLY)
CD4 % Helper T Cell: 28 % — ABNORMAL LOW (ref 33–55)
CD4 T Cell Abs: 520 /uL (ref 400–2700)

## 2016-03-29 LAB — RPR

## 2016-03-30 LAB — CULTURE, URINE COMPREHENSIVE: ORGANISM ID, BACTERIA: NO GROWTH

## 2016-03-31 LAB — HIV-1 RNA QUANT-NO REFLEX-BLD
HIV 1 RNA QUANT: 23 {copies}/mL — AB (ref <20)
HIV-1 RNA Quant, Log: 1.36 Log copies/mL — ABNORMAL HIGH (ref <1.30)

## 2016-04-03 ENCOUNTER — Ambulatory Visit: Payer: BLUE CROSS/BLUE SHIELD

## 2016-04-18 ENCOUNTER — Encounter: Payer: Self-pay | Admitting: Internal Medicine

## 2016-04-18 ENCOUNTER — Ambulatory Visit (INDEPENDENT_AMBULATORY_CARE_PROVIDER_SITE_OTHER): Payer: BLUE CROSS/BLUE SHIELD | Admitting: Internal Medicine

## 2016-04-18 VITALS — BP 100/63 | HR 79 | Temp 98.4°F | Ht 65.0 in | Wt 155.0 lb

## 2016-04-18 DIAGNOSIS — B20 Human immunodeficiency virus [HIV] disease: Secondary | ICD-10-CM | POA: Diagnosis not present

## 2016-04-18 DIAGNOSIS — B308 Other viral conjunctivitis: Secondary | ICD-10-CM

## 2016-04-18 DIAGNOSIS — N3944 Nocturnal enuresis: Secondary | ICD-10-CM

## 2016-04-18 MED ORDER — IMIPRAMINE HCL 10 MG PO TABS: 10.0000 mg | ORAL_TABLET | Freq: Every day | ORAL | 3 refills | Status: DC

## 2016-04-18 MED ORDER — FLUCONAZOLE 150 MG PO TABS
150.0000 mg | ORAL_TABLET | ORAL | 0 refills | Status: DC
Start: 2016-04-18 — End: 2017-05-17
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
## 2016-04-18 NOTE — Patient Instructions (Signed)
   Go to pharmacy, and ask for lubricating eye drops. For itchy eyes

## 2016-05-25 ENCOUNTER — Ambulatory Visit: Payer: BLUE CROSS/BLUE SHIELD | Admitting: Internal Medicine

## 2016-06-16 ENCOUNTER — Other Ambulatory Visit: Payer: Self-pay | Admitting: Internal Medicine

## 2016-06-16 DIAGNOSIS — B20 Human immunodeficiency virus [HIV] disease: Secondary | ICD-10-CM

## 2016-08-17 ENCOUNTER — Encounter: Payer: Self-pay | Admitting: Internal Medicine

## 2016-09-21 ENCOUNTER — Emergency Department (HOSPITAL_COMMUNITY)
Admission: EM | Admit: 2016-09-21 | Discharge: 2016-09-21 | Disposition: A | Discharge status: Home / Self Care | Payer: BLUE CROSS/BLUE SHIELD | Attending: Emergency Medicine | Admitting: Emergency Medicine

## 2016-09-21 ENCOUNTER — Encounter (HOSPITAL_COMMUNITY): Payer: Self-pay

## 2016-09-21 DIAGNOSIS — R2242 Localized swelling, mass and lump, left lower limb: Secondary | ICD-10-CM | POA: Diagnosis present

## 2016-09-21 DIAGNOSIS — L02416 Cutaneous abscess of left lower limb: Secondary | ICD-10-CM | POA: Diagnosis not present

## 2016-09-21 DIAGNOSIS — Z79899 Other long term (current) drug therapy: Secondary | ICD-10-CM | POA: Diagnosis not present

## 2016-09-21 DIAGNOSIS — Z21 Asymptomatic human immunodeficiency virus [HIV] infection status: Secondary | ICD-10-CM | POA: Diagnosis not present

## 2016-09-21 DIAGNOSIS — L0291 Cutaneous abscess, unspecified: Secondary | ICD-10-CM

## 2016-09-21 MED ORDER — IBUPROFEN 400 MG PO TABS
600.0000 mg | ORAL_TABLET | Freq: Once | ORAL | Status: AC
  Administered 2016-09-21: 19:00:00 600 mg via ORAL
  Filled 2016-09-21: qty 1

## 2016-09-21 MED ORDER — SULFAMETHOXAZOLE-TRIMETHOPRIM 800-160 MG PO TABS: 1.0000 | ORAL_TABLET | Freq: Two times a day (BID) | ORAL | 0 refills | Status: AC

## 2016-09-21 MED ORDER — SULFAMETHOXAZOLE-TRIMETHOPRIM 800-160 MG PO TABS
1.0000 | ORAL_TABLET | Freq: Once | ORAL | Status: AC
  Administered 2016-09-21: 1 via ORAL
  Filled 2016-09-21: qty 1

## 2016-09-21 MED ORDER — LIDOCAINE HCL 2 % IJ SOLN
20.0000 mL | Freq: Once | INTRAMUSCULAR | Status: AC
  Administered 2016-09-21: 400 mg
  Filled 2016-09-21: qty 20

## 2016-09-21 NOTE — ED Notes (Signed)
Left anterior thigh with red, indurated area draining serous fluid, site warm and erythematous.
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
## 2016-09-21 NOTE — Discharge Instructions (Signed)
Please read attached information. If you experience any new or worsening signs or symptoms please return to the emergency room for evaluation. Please follow-up with your primary care provider or specialist as discussed. Please use medication prescribed only as directed and discontinue taking if you have any concerning signs or symptoms.   °

## 2016-09-21 NOTE — ED Triage Notes (Signed)
Pt noticed itchy bump to left thigh Friday. Since then it has become increasingly more painful, red, and swollen. Infected area on thigh has some serous drainage in triage and appears cellulitic.

## 2016-10-26 ENCOUNTER — Ambulatory Visit: Payer: BLUE CROSS/BLUE SHIELD | Admitting: Internal Medicine

## 2016-10-30 ENCOUNTER — Encounter (HOSPITAL_COMMUNITY): Payer: Self-pay | Admitting: *Deleted

## 2016-10-30 DIAGNOSIS — Y939 Activity, unspecified: Secondary | ICD-10-CM | POA: Insufficient documentation

## 2016-10-30 DIAGNOSIS — B20 Human immunodeficiency virus [HIV] disease: Secondary | ICD-10-CM | POA: Insufficient documentation

## 2016-10-30 DIAGNOSIS — Z79899 Other long term (current) drug therapy: Secondary | ICD-10-CM | POA: Diagnosis not present

## 2016-10-30 DIAGNOSIS — W4904XA Ring or other jewelry causing external constriction, initial encounter: Secondary | ICD-10-CM | POA: Diagnosis not present

## 2016-10-30 DIAGNOSIS — S60444A External constriction of right ring finger, initial encounter: Secondary | ICD-10-CM | POA: Diagnosis not present

## 2016-10-30 DIAGNOSIS — Y929 Unspecified place or not applicable: Secondary | ICD-10-CM | POA: Diagnosis not present

## 2016-10-30 DIAGNOSIS — Y999 Unspecified external cause status: Secondary | ICD-10-CM | POA: Diagnosis not present

## 2016-10-30 NOTE — ED Triage Notes (Signed)
Pt says she put a ring on her right hand fourth finger today and now unable to get it off. She has used soap/lotion without relief.

## 2016-10-31 ENCOUNTER — Emergency Department (HOSPITAL_COMMUNITY)
Admission: EM | Admit: 2016-10-31 | Discharge: 2016-10-31 | Disposition: A | Discharge status: Home / Self Care | Payer: BLUE CROSS/BLUE SHIELD | Attending: Emergency Medicine | Admitting: Emergency Medicine

## 2016-10-31 DIAGNOSIS — S60449A External constriction of unspecified finger, initial encounter: Secondary | ICD-10-CM

## 2016-10-31 DIAGNOSIS — W4904XA Ring or other jewelry causing external constriction, initial encounter: Secondary | ICD-10-CM
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
## 2017-01-13 ENCOUNTER — Other Ambulatory Visit: Payer: Self-pay | Admitting: Internal Medicine

## 2017-01-13 DIAGNOSIS — B20 Human immunodeficiency virus [HIV] disease: Secondary | ICD-10-CM

## 2017-02-17 ENCOUNTER — Other Ambulatory Visit: Payer: Self-pay | Admitting: Internal Medicine

## 2017-02-17 DIAGNOSIS — B20 Human immunodeficiency virus [HIV] disease: Secondary | ICD-10-CM

## 2017-03-24 ENCOUNTER — Other Ambulatory Visit: Payer: Self-pay | Admitting: Internal Medicine

## 2017-03-24 DIAGNOSIS — B20 Human immunodeficiency virus [HIV] disease: Secondary | ICD-10-CM

## 2017-03-29 ENCOUNTER — Other Ambulatory Visit: Payer: Self-pay | Admitting: Internal Medicine

## 2017-03-29 DIAGNOSIS — B20 Human immunodeficiency virus [HIV] disease: Secondary | ICD-10-CM

## 2017-04-02 ENCOUNTER — Ambulatory Visit: Payer: BLUE CROSS/BLUE SHIELD

## 2017-04-26 ENCOUNTER — Other Ambulatory Visit: Payer: Self-pay | Admitting: *Deleted

## 2017-04-26 DIAGNOSIS — Z113 Encounter for screening for infections with a predominantly sexual mode of transmission: Secondary | ICD-10-CM

## 2017-04-26 DIAGNOSIS — B20 Human immunodeficiency virus [HIV] disease: Secondary | ICD-10-CM

## 2017-04-26 NOTE — Progress Notes (Signed)
hiv

## 2017-05-03 ENCOUNTER — Other Ambulatory Visit (HOSPITAL_COMMUNITY)
Admission: RE | Admit: 2017-05-03 | Discharge: 2017-05-03 | Disposition: A | Discharge status: Home / Self Care | Payer: BLUE CROSS/BLUE SHIELD | Source: Ambulatory Visit | Attending: Internal Medicine | Admitting: Internal Medicine

## 2017-05-03 ENCOUNTER — Other Ambulatory Visit: Payer: BLUE CROSS/BLUE SHIELD

## 2017-05-03 DIAGNOSIS — Z113 Encounter for screening for infections with a predominantly sexual mode of transmission: Secondary | ICD-10-CM

## 2017-05-03 DIAGNOSIS — B20 Human immunodeficiency virus [HIV] disease: Secondary | ICD-10-CM

## 2017-05-04 LAB — CBC WITH DIFFERENTIAL/PLATELET
Basophils Absolute: 32 cells/uL (ref 0–200)
Basophils Relative: 0.9 %
Eosinophils Absolute: 130 cells/uL (ref 15–500)
Eosinophils Relative: 3.7 %
HCT: 34.6 % — ABNORMAL LOW (ref 35.0–45.0)
Hemoglobin: 11.8 g/dL (ref 11.7–15.5)
Lymphs Abs: 2209 cells/uL (ref 850–3900)
MCH: 32.7 pg (ref 27.0–33.0)
MCHC: 34.1 g/dL (ref 32.0–36.0)
MCV: 95.8 fL (ref 80.0–100.0)
MPV: 12.4 fL (ref 7.5–12.5)
Monocytes Relative: 9.2 %
NEUTROS PCT: 23.1 %
Neutro Abs: 809 cells/uL — ABNORMAL LOW (ref 1500–7800)
PLATELETS: 238 10*3/uL (ref 140–400)
RBC: 3.61 10*6/uL — AB (ref 3.80–5.10)
RDW: 12 % (ref 11.0–15.0)
TOTAL LYMPHOCYTE: 63.1 %
WBC: 3.5 10*3/uL — ABNORMAL LOW (ref 3.8–10.8)
WBCMIX: 322 {cells}/uL (ref 200–950)

## 2017-05-04 LAB — COMPLETE METABOLIC PANEL WITH GFR
AG RATIO: 1.3 (calc) (ref 1.0–2.5)
ALBUMIN MSPROF: 4 g/dL (ref 3.6–5.1)
ALKALINE PHOSPHATASE (APISO): 109 U/L (ref 33–115)
ALT: 6 U/L (ref 6–29)
AST: 15 U/L (ref 10–30)
BILIRUBIN TOTAL: 0.3 mg/dL (ref 0.2–1.2)
BUN: 11 mg/dL (ref 7–25)
CHLORIDE: 108 mmol/L (ref 98–110)
CO2: 23 mmol/L (ref 20–32)
CREATININE: 0.76 mg/dL (ref 0.50–1.10)
Calcium: 9.2 mg/dL (ref 8.6–10.2)
GFR, Est African American: 127 mL/min/{1.73_m2} (ref >=60)
GFR, Est Non African American: 110 mL/min/{1.73_m2} (ref >=60)
GLOBULIN: 3.1 g/dL (ref 1.9–3.7)
Glucose, Bld: 90 mg/dL (ref 65–99)
POTASSIUM: 4.7 mmol/L (ref 3.5–5.3)
SODIUM: 138 mmol/L (ref 135–146)
Total Protein: 7.1 g/dL (ref 6.1–8.1)

## 2017-05-04 LAB — URINE CYTOLOGY ANCILLARY ONLY
Chlamydia: NEGATIVE
Neisseria Gonorrhea: NEGATIVE

## 2017-05-04 LAB — T-HELPER CELL (CD4) - (RCID CLINIC ONLY)
CD4 T CELL ABS: 650 /uL (ref 400–2700)
CD4 T CELL HELPER: 30 % — AB (ref 33–55)

## 2017-05-04 LAB — RPR: RPR Ser Ql: NONREACTIVE

## 2017-05-05 LAB — HIV-1 RNA QUANT-NO REFLEX-BLD
HIV 1 RNA Quant: 60 copies/mL — ABNORMAL HIGH
HIV-1 RNA QUANT, LOG: 1.78 {Log_copies}/mL — AB

## 2017-05-07 ENCOUNTER — Other Ambulatory Visit: Payer: BLUE CROSS/BLUE SHIELD

## 2017-05-17 ENCOUNTER — Other Ambulatory Visit: Payer: Self-pay

## 2017-05-17 ENCOUNTER — Encounter: Payer: Self-pay | Admitting: Internal Medicine

## 2017-05-17 ENCOUNTER — Ambulatory Visit (INDEPENDENT_AMBULATORY_CARE_PROVIDER_SITE_OTHER): Payer: BLUE CROSS/BLUE SHIELD | Admitting: Internal Medicine

## 2017-05-17 VITALS — BP 114/78 | HR 76 | Temp 98.6°F | Wt 168.0 lb

## 2017-05-17 DIAGNOSIS — B20 Human immunodeficiency virus [HIV] disease: Secondary | ICD-10-CM

## 2017-05-17 DIAGNOSIS — Z23 Encounter for immunization: Secondary | ICD-10-CM

## 2017-05-17 DIAGNOSIS — Z9189 Other specified personal risk factors, not elsewhere classified: Secondary | ICD-10-CM

## 2017-05-17 DIAGNOSIS — R635 Abnormal weight gain: Secondary | ICD-10-CM | POA: Diagnosis not present

## 2017-05-17 MED ORDER — ATOVAQUONE-PROGUANIL HCL 250-100 MG PO TABS: 1.0000 | ORAL_TABLET | Freq: Every day | ORAL | 0 refills | Status: DC
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
## 2017-05-22 ENCOUNTER — Ambulatory Visit: Payer: BLUE CROSS/BLUE SHIELD | Admitting: Internal Medicine

## 2017-06-19 ENCOUNTER — Other Ambulatory Visit: Payer: Self-pay | Admitting: Internal Medicine

## 2017-09-09 ENCOUNTER — Other Ambulatory Visit: Payer: Self-pay | Admitting: Internal Medicine

## 2017-09-09 DIAGNOSIS — B20 Human immunodeficiency virus [HIV] disease: Secondary | ICD-10-CM

## 2017-09-10 ENCOUNTER — Ambulatory Visit: Payer: BLUE CROSS/BLUE SHIELD | Admitting: Internal Medicine

## 2018-02-16 ENCOUNTER — Other Ambulatory Visit: Payer: Self-pay | Admitting: Internal Medicine

## 2018-02-16 DIAGNOSIS — B20 Human immunodeficiency virus [HIV] disease: Secondary | ICD-10-CM

## 2018-04-25 ENCOUNTER — Other Ambulatory Visit: Payer: Self-pay

## 2018-04-25 DIAGNOSIS — Z113 Encounter for screening for infections with a predominantly sexual mode of transmission: Secondary | ICD-10-CM

## 2018-04-25 DIAGNOSIS — B20 Human immunodeficiency virus [HIV] disease: Secondary | ICD-10-CM

## 2018-04-25 DIAGNOSIS — Z79899 Other long term (current) drug therapy: Secondary | ICD-10-CM

## 2018-04-29 ENCOUNTER — Ambulatory Visit: Payer: BLUE CROSS/BLUE SHIELD

## 2018-04-29 ENCOUNTER — Other Ambulatory Visit: Payer: BLUE CROSS/BLUE SHIELD

## 2018-04-29 ENCOUNTER — Other Ambulatory Visit (HOSPITAL_COMMUNITY)
Admission: RE | Admit: 2018-04-29 | Discharge: 2018-04-29 | Disposition: A | Discharge status: Home / Self Care | Payer: BLUE CROSS/BLUE SHIELD | Source: Ambulatory Visit | Attending: Internal Medicine | Admitting: Internal Medicine

## 2018-04-29 DIAGNOSIS — Z113 Encounter for screening for infections with a predominantly sexual mode of transmission: Secondary | ICD-10-CM

## 2018-04-29 DIAGNOSIS — B20 Human immunodeficiency virus [HIV] disease: Secondary | ICD-10-CM

## 2018-04-29 DIAGNOSIS — Z79899 Other long term (current) drug therapy: Secondary | ICD-10-CM

## 2018-04-30 LAB — T-HELPER CELLS (CD4) COUNT (NOT AT ARMC)
CD4 % Helper T Cell: 36 % (ref 33–55)
CD4 T Cell Abs: 700 /uL (ref 400–2700)

## 2018-04-30 LAB — URINE CYTOLOGY ANCILLARY ONLY
Chlamydia: NEGATIVE
Neisseria Gonorrhea: NEGATIVE

## 2018-05-01 LAB — LIPID PANEL
Cholesterol: 184 mg/dL (ref <200)
HDL: 66 mg/dL (ref >=50)
LDL Cholesterol (Calc): 103 mg/dL (calc) — ABNORMAL HIGH
Non-HDL Cholesterol (Calc): 118 mg/dL (calc) (ref <130)
Total CHOL/HDL Ratio: 2.8 (calc) (ref <5.0)
Triglycerides: 66 mg/dL (ref <150)

## 2018-05-01 LAB — CBC WITH DIFFERENTIAL/PLATELET
Absolute Monocytes: 211 cells/uL (ref 200–950)
Basophils Absolute: 31 cells/uL (ref 0–200)
Basophils Relative: 1 %
EOS PCT: 3.2 %
Eosinophils Absolute: 99 cells/uL (ref 15–500)
HCT: 35 % (ref 35.0–45.0)
HEMOGLOBIN: 11.6 g/dL — AB (ref 11.7–15.5)
Lymphs Abs: 2099 cells/uL (ref 850–3900)
MCH: 32.9 pg (ref 27.0–33.0)
MCHC: 33.1 g/dL (ref 32.0–36.0)
MCV: 99.2 fL (ref 80.0–100.0)
MPV: 13 fL — AB (ref 7.5–12.5)
Monocytes Relative: 6.8 %
NEUTROS ABS: 660 {cells}/uL — AB (ref 1500–7800)
Neutrophils Relative %: 21.3 %
Platelets: 222 10*3/uL (ref 140–400)
RBC: 3.53 10*6/uL — ABNORMAL LOW (ref 3.80–5.10)
RDW: 12 % (ref 11.0–15.0)
Total Lymphocyte: 67.7 %
WBC: 3.1 10*3/uL — ABNORMAL LOW (ref 3.8–10.8)

## 2018-05-01 LAB — COMPREHENSIVE METABOLIC PANEL
AG Ratio: 1.3 (calc) (ref 1.0–2.5)
ALT: 8 U/L (ref 6–29)
AST: 15 U/L (ref 10–30)
Albumin: 4 g/dL (ref 3.6–5.1)
Alkaline phosphatase (APISO): 75 U/L (ref 31–125)
BUN: 11 mg/dL (ref 7–25)
CO2: 27 mmol/L (ref 20–32)
Calcium: 9.2 mg/dL (ref 8.6–10.2)
Chloride: 106 mmol/L (ref 98–110)
Creat: 0.83 mg/dL (ref 0.50–1.10)
Globulin: 3.1 g/dL (calc) (ref 1.9–3.7)
Glucose, Bld: 78 mg/dL (ref 65–99)
Potassium: 4.5 mmol/L (ref 3.5–5.3)
Sodium: 139 mmol/L (ref 135–146)
Total Bilirubin: 0.3 mg/dL (ref 0.2–1.2)
Total Protein: 7.1 g/dL (ref 6.1–8.1)

## 2018-05-01 LAB — RPR: RPR Ser Ql: NONREACTIVE

## 2018-05-01 LAB — HIV-1 RNA QUANT-NO REFLEX-BLD
HIV 1 RNA Quant: 134 copies/mL — ABNORMAL HIGH
HIV-1 RNA Quant, Log: 2.13 Log copies/mL — ABNORMAL HIGH

## 2018-05-13 ENCOUNTER — Ambulatory Visit (INDEPENDENT_AMBULATORY_CARE_PROVIDER_SITE_OTHER): Payer: BC Managed Care – PPO | Admitting: Internal Medicine

## 2018-05-13 ENCOUNTER — Telehealth: Payer: Self-pay | Admitting: Pharmacy Technician

## 2018-05-13 ENCOUNTER — Encounter: Payer: Self-pay | Admitting: Internal Medicine

## 2018-05-13 VITALS — BP 112/66 | HR 83 | Temp 98.7°F | Wt 158.0 lb

## 2018-05-13 DIAGNOSIS — Z789 Other specified health status: Secondary | ICD-10-CM | POA: Diagnosis not present

## 2018-05-13 DIAGNOSIS — Z7189 Other specified counseling: Secondary | ICD-10-CM

## 2018-05-13 DIAGNOSIS — B20 Human immunodeficiency virus [HIV] disease: Secondary | ICD-10-CM

## 2018-05-13 DIAGNOSIS — Z9189 Other specified personal risk factors, not elsewhere classified: Secondary | ICD-10-CM

## 2018-05-13 DIAGNOSIS — Z7185 Encounter for immunization safety counseling: Secondary | ICD-10-CM

## 2018-05-13 DIAGNOSIS — Z7184 Encounter for health counseling related to travel: Secondary | ICD-10-CM | POA: Diagnosis not present

## 2018-05-13 DIAGNOSIS — Z23 Encounter for immunization: Secondary | ICD-10-CM

## 2018-05-13 MED ORDER — ATOVAQUONE-PROGUANIL HCL 250-100 MG PO TABS: 1.0000 | ORAL_TABLET | Freq: Every day | ORAL | 0 refills | Status: DC

## 2018-05-13 NOTE — Telephone Encounter (Signed)
Do we have extra bottles here to give to her? Or something similar
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
## 2018-05-13 NOTE — Telephone Encounter (Signed)
[facility] Patient Advocate Encounter  Patient is going out of the country and needs a vacation override in order to get three months worth of medication.  I called patient's insurance company [employer] to get the override and was told that for this particular medication overrides can only come from the benefits department of patient's company.  I spoke with three different people at the insurance company and was told the same information that there was not a vacation override for this medication.   [employer] Benefits Department  (phone) [phone]  [employer] authorization department (phone) [phone] [employer] pharmacy department (phone) [phone]  Information was given to the patient so that she could reach out to her HR department.  She understood and knows to call us back with any additional questions.  [name], CPhT Specialty Pharmacy Patient [facility] Phone: [phone] Fax: [phone] 05/13/2018 1:44 PM

## 2018-05-15 LAB — HIV-1 RNA QUANT-NO REFLEX-BLD
HIV 1 RNA Quant: 66 copies/mL — ABNORMAL HIGH
HIV-1 RNA Quant, Log: 1.82 Log copies/mL — ABNORMAL HIGH

## 2018-05-15 NOTE — Telephone Encounter (Signed)
[facility] Patient Advocate Encounter  Spoke with patient this morning for follow-up.  She was on her way to the airport, departing today [place]15[place]2018 and would be gone for two months.  [employer] Benefits department was able to provide her with the override and she was successful in obtaining two months worth of medication. She had no further questions and would reconnect once she returned from her trip.

## 2018-09-16 ENCOUNTER — Encounter: Payer: Self-pay | Admitting: Internal Medicine

## 2018-09-16 ENCOUNTER — Other Ambulatory Visit: Payer: Self-pay

## 2018-09-16 ENCOUNTER — Ambulatory Visit (INDEPENDENT_AMBULATORY_CARE_PROVIDER_SITE_OTHER): Payer: BC Managed Care – PPO | Admitting: Internal Medicine

## 2018-09-16 VITALS — BP 107/75 | HR 79 | Temp 98.4°F | Wt 157.0 lb

## 2018-09-16 DIAGNOSIS — B20 Human immunodeficiency virus [HIV] disease: Secondary | ICD-10-CM | POA: Diagnosis not present

## 2018-09-16 DIAGNOSIS — R059 Cough, unspecified: Secondary | ICD-10-CM

## 2018-09-16 DIAGNOSIS — J069 Acute upper respiratory infection, unspecified: Secondary | ICD-10-CM | POA: Diagnosis not present

## 2018-09-16 DIAGNOSIS — U071 COVID-19: Secondary | ICD-10-CM

## 2018-09-16 DIAGNOSIS — R05 Cough: Secondary | ICD-10-CM

## 2018-09-16 MED ORDER — GENVOYA 150-150-200-10 MG PO TABS: 1.0000 | ORAL_TABLET | Freq: Every day | ORAL | 5 refills | Status: DC

## 2018-09-16 NOTE — Patient Instructions (Signed)
Sheena Wallace, Based on your symptoms at our visit, we will test you for SARS-CoV2 (covid-19). You will need to stay home(self-quarantine), and not go to work until you get your results back (likely in 3-5 days). Try not to go out in public unless you have to until test results come back  Always wear a mask when in public
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
## 2018-09-19 ENCOUNTER — Telehealth: Payer: Self-pay | Admitting: *Deleted

## 2018-09-19 LAB — SARS-COV-2 RNA,(COVID-19) QUALITATIVE NAAT: SARS CoV2 RNA: DETECTED — AB

## 2018-09-19 NOTE — Telephone Encounter (Signed)
Patient called to get the results of her recent Covid swab. Advised will have patient review and give her a call back once she responds.

## 2018-09-23 ENCOUNTER — Telehealth: Payer: Self-pay | Admitting: *Deleted

## 2018-09-23 ENCOUNTER — Ambulatory Visit: Payer: BLUE CROSS/BLUE SHIELD | Admitting: Internal Medicine

## 2018-09-23 NOTE — Telephone Encounter (Signed)
Patient called to advise she will need a letter to return to work 09/30/18. Advised her to come by the office that Monday as her provider will be here and we can get the letter for her at that time.

## 2018-09-26 ENCOUNTER — Other Ambulatory Visit: Payer: Self-pay | Admitting: *Deleted

## 2018-09-26 DIAGNOSIS — J069 Acute upper respiratory infection, unspecified: Secondary | ICD-10-CM

## 2018-09-27 ENCOUNTER — Other Ambulatory Visit: Payer: Self-pay | Admitting: Internal Medicine

## 2018-09-27 DIAGNOSIS — Z20822 Contact with and (suspected) exposure to covid-19: Secondary | ICD-10-CM

## 2018-10-02 LAB — NOVEL CORONAVIRUS, NAA: SARS-CoV-2, NAA: NOT DETECTED

## 2018-10-03 ENCOUNTER — Telehealth: Payer: Self-pay

## 2018-10-03 NOTE — Telephone Encounter (Signed)
Patient called office today requesting a letter for work regarding lab work. Patient would like letter to explain she was tested for Covid-19 on 7/6 and was positive, and that she was under quarantine for 10 days. Also would like letter to state she was retested on 7/17, and how test came back negative. Patient states she will need letter for by 7/24. Will forward message to MD Aundria Rud, [place]

## 2018-10-04 ENCOUNTER — Encounter: Payer: Self-pay | Admitting: *Deleted

## 2018-10-04 NOTE — Telephone Encounter (Signed)
Letter and test results at front desk for patient to pick up. She will come 7/27.

## 2018-10-04 NOTE — Progress Notes (Signed)
Spoke with Dr Baxter Flattery, ok to write letter for patient to return to work 10/07/2018.  Notified patient who requested no mention of [facility] in letter head.  Letter will be available for pick up Monday 7/27 at patient's request. Landis Gandy, RN

## 2018-10-14 ENCOUNTER — Ambulatory Visit: Payer: BC Managed Care – PPO | Admitting: Internal Medicine

## 2018-11-04 ENCOUNTER — Ambulatory Visit: Payer: BC Managed Care – PPO | Admitting: Internal Medicine

## 2018-11-07 ENCOUNTER — Telehealth: Payer: Self-pay

## 2018-11-07 NOTE — Telephone Encounter (Signed)
COVID-19 Pre-Screening Questions:11/07/18  Do you currently have a fever (>100 F), chills or unexplained body aches? NO  Are you currently experiencing new cough, shortness of breath, sore throat, runny nose? NO  .  Have you recently travelled outside the state of [place] in the last 14 days? NO  .  Have you been in contact with someone that is currently pending confirmation of Covid19 testing or has been confirmed to have the Covid19 virus?  NO   **If the patient answers NO to ALL questions -  advise the patient to please call the clinic before coming to the office should any symptoms develop.     

## 2018-11-11 ENCOUNTER — Encounter: Payer: Self-pay | Admitting: Internal Medicine

## 2018-11-11 ENCOUNTER — Other Ambulatory Visit: Payer: Self-pay

## 2018-11-11 ENCOUNTER — Ambulatory Visit (INDEPENDENT_AMBULATORY_CARE_PROVIDER_SITE_OTHER): Payer: BC Managed Care – PPO | Admitting: Internal Medicine

## 2018-11-11 VITALS — BP 103/70 | HR 87 | Temp 98.3°F

## 2018-11-11 DIAGNOSIS — B20 Human immunodeficiency virus [HIV] disease: Secondary | ICD-10-CM | POA: Diagnosis not present

## 2018-11-11 DIAGNOSIS — Z9189 Other specified personal risk factors, not elsewhere classified: Secondary | ICD-10-CM | POA: Diagnosis not present

## 2018-11-11 DIAGNOSIS — Z3169 Encounter for other general counseling and advice on procreation: Secondary | ICD-10-CM | POA: Diagnosis not present
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
## 2018-11-12 LAB — URINE CYTOLOGY ANCILLARY ONLY
Chlamydia: NEGATIVE
Neisseria Gonorrhea: NEGATIVE

## 2018-11-12 LAB — T-HELPER CELL (CD4) - (RCID CLINIC ONLY)
CD4 % Helper T Cell: 34 % (ref 33–65)
CD4 T Cell Abs: 818 /uL (ref 400–1790)

## 2018-11-14 LAB — COMPLETE METABOLIC PANEL WITH GFR
AG Ratio: 1.4 (calc) (ref 1.0–2.5)
ALT: 6 U/L (ref 6–29)
AST: 15 U/L (ref 10–30)
Albumin: 4.4 g/dL (ref 3.6–5.1)
Alkaline phosphatase (APISO): 75 U/L (ref 31–125)
BUN: 11 mg/dL (ref 7–25)
CO2: 27 mmol/L (ref 20–32)
Calcium: 9.5 mg/dL (ref 8.6–10.2)
Chloride: 107 mmol/L (ref 98–110)
Creat: 0.85 mg/dL (ref 0.50–1.10)
GFR, Est African American: 110 mL/min/{1.73_m2} (ref >=60)
GFR, Est Non African American: 95 mL/min/{1.73_m2} (ref >=60)
Globulin: 3.1 g/dL (calc) (ref 1.9–3.7)
Glucose, Bld: 81 mg/dL (ref 65–99)
Potassium: 4.3 mmol/L (ref 3.5–5.3)
Sodium: 141 mmol/L (ref 135–146)
Total Bilirubin: 0.3 mg/dL (ref 0.2–1.2)
Total Protein: 7.5 g/dL (ref 6.1–8.1)

## 2018-11-14 LAB — HIV-1 RNA QUANT-NO REFLEX-BLD
HIV 1 RNA Quant: 86 copies/mL — ABNORMAL HIGH
HIV-1 RNA Quant, Log: 1.93 Log copies/mL — ABNORMAL HIGH

## 2018-11-14 LAB — CBC WITH DIFFERENTIAL/PLATELET
Absolute Monocytes: 350 cells/uL (ref 200–950)
Basophils Absolute: 41 cells/uL (ref 0–200)
Basophils Relative: 0.9 %
Eosinophils Absolute: 161 cells/uL (ref 15–500)
Eosinophils Relative: 3.5 %
HCT: 35.3 % (ref 35.0–45.0)
Hemoglobin: 11.7 g/dL (ref 11.7–15.5)
Lymphs Abs: 2590 cells/uL (ref 850–3900)
MCH: 32.3 pg (ref 27.0–33.0)
MCHC: 33.1 g/dL (ref 32.0–36.0)
MCV: 97.5 fL (ref 80.0–100.0)
MPV: 12 fL (ref 7.5–12.5)
Monocytes Relative: 7.6 %
Neutro Abs: 1458 cells/uL — ABNORMAL LOW (ref 1500–7800)
Neutrophils Relative %: 31.7 %
Platelets: 277 10*3/uL (ref 140–400)
RBC: 3.62 10*6/uL — ABNORMAL LOW (ref 3.80–5.10)
RDW: 12.6 % (ref 11.0–15.0)
Total Lymphocyte: 56.3 %
WBC: 4.6 10*3/uL (ref 3.8–10.8)

## 2018-11-14 LAB — RPR: RPR Ser Ql: NONREACTIVE

## 2018-12-02 ENCOUNTER — Ambulatory Visit: Payer: BC Managed Care – PPO | Admitting: Infectious Diseases

## 2018-12-05 ENCOUNTER — Other Ambulatory Visit: Payer: Self-pay | Admitting: Internal Medicine

## 2018-12-05 DIAGNOSIS — B20 Human immunodeficiency virus [HIV] disease: Secondary | ICD-10-CM

## 2018-12-09 ENCOUNTER — Telehealth: Payer: Self-pay

## 2018-12-09 DIAGNOSIS — B20 Human immunodeficiency virus [HIV] disease: Secondary | ICD-10-CM

## 2018-12-09 MED ORDER — GENVOYA 150-150-200-10 MG PO TABS: 1.0000 | ORAL_TABLET | Freq: Every day | ORAL | 3 refills | Status: DC

## 2019-02-10 ENCOUNTER — Other Ambulatory Visit: Payer: BC Managed Care – PPO

## 2019-02-10 ENCOUNTER — Other Ambulatory Visit: Payer: Self-pay

## 2019-02-10 DIAGNOSIS — B20 Human immunodeficiency virus [HIV] disease: Secondary | ICD-10-CM

## 2019-02-17 ENCOUNTER — Ambulatory Visit: Payer: BC Managed Care – PPO | Admitting: Infectious Diseases

## 2019-02-24 ENCOUNTER — Encounter: Payer: BC Managed Care – PPO | Admitting: Internal Medicine

## 2019-03-24 ENCOUNTER — Telehealth: Payer: Self-pay

## 2019-03-24 ENCOUNTER — Other Ambulatory Visit: Payer: Self-pay

## 2019-03-24 DIAGNOSIS — B20 Human immunodeficiency virus [HIV] disease: Secondary | ICD-10-CM

## 2019-03-24 MED ORDER — GENVOYA 150-150-200-10 MG PO TABS: 1.0000 | ORAL_TABLET | Freq: Every day | ORAL | 3 refills | Status: DC

## 2019-03-24 NOTE — Telephone Encounter (Signed)
Received prior authorization request for patient's Genvoya. Contacted patient's plan to get paperwork for prior authorization. Awaiting fax.   Leodan Bolyard Loyola Mast, RN

## 2019-03-31 ENCOUNTER — Telehealth: Payer: Self-pay | Admitting: Pharmacy Technician

## 2019-03-31 ENCOUNTER — Other Ambulatory Visit: Payer: BC Managed Care – PPO

## 2019-03-31 ENCOUNTER — Other Ambulatory Visit: Payer: Self-pay

## 2019-03-31 DIAGNOSIS — B20 Human immunodeficiency virus [HIV] disease: Secondary | ICD-10-CM

## 2019-04-01 LAB — T-HELPER CELL (CD4) - (RCID CLINIC ONLY)
CD4 % Helper T Cell: 30 % — ABNORMAL LOW (ref 33–65)
CD4 T Cell Abs: 566 /uL (ref 400–1790)

## 2019-04-01 NOTE — Telephone Encounter (Addendum)
[facility] Patient Advocate Encounter   I reached out to [facility] where the patient's [facility] prescription was sent.  They have their own copay assistance organization called [employer] underwritten by [employer] that gets copay assistance for their patient's.  It had been in process.  The representative got the information from this group and processed.  The patient's copay is now zero.  They will reach out to her and schedule delivery.  Netty Starring. Dimas Aguas CPhT [facility] Patient [facility] Phone: [phone] Fax:  [phone]

## 2019-04-01 NOTE — Telephone Encounter (Signed)
You are awesome - thank you so much :)

## 2019-04-03 LAB — HIV-1 RNA QUANT-NO REFLEX-BLD
HIV 1 RNA Quant: <20 copies/mL — AB
HIV-1 RNA Quant, Log: <1.3 Log copies/mL — AB

## 2019-04-14 ENCOUNTER — Encounter: Payer: BC Managed Care – PPO | Admitting: Internal Medicine

## 2019-04-28 ENCOUNTER — Ambulatory Visit (INDEPENDENT_AMBULATORY_CARE_PROVIDER_SITE_OTHER): Payer: BC Managed Care – PPO | Admitting: Internal Medicine

## 2019-04-28 ENCOUNTER — Encounter: Payer: Self-pay | Admitting: Internal Medicine

## 2019-04-28 ENCOUNTER — Other Ambulatory Visit: Payer: Self-pay

## 2019-04-28 VITALS — BP 108/72 | HR 65 | Wt 152.0 lb

## 2019-04-28 DIAGNOSIS — N926 Irregular menstruation, unspecified: Secondary | ICD-10-CM | POA: Diagnosis not present

## 2019-04-28 DIAGNOSIS — B20 Human immunodeficiency virus [HIV] disease: Secondary | ICD-10-CM | POA: Diagnosis not present

## 2019-04-28 DIAGNOSIS — Z79899 Other long term (current) drug therapy: Secondary | ICD-10-CM | POA: Diagnosis not present
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
## 2019-07-07 ENCOUNTER — Other Ambulatory Visit: Payer: Self-pay | Admitting: Internal Medicine

## 2019-07-07 DIAGNOSIS — B20 Human immunodeficiency virus [HIV] disease: Secondary | ICD-10-CM

## 2019-08-18 ENCOUNTER — Telehealth: Payer: Self-pay

## 2019-08-18 NOTE — Telephone Encounter (Signed)
Patient called states she is nervous because she has been having pain when she "pees" and when she pushes to use the bathroom. States this has been ongoing for about one month now and this point she is nervous that it may be something serious. Advised patient to seek care at the urgent care being that she does not follow up with Dr. Drue Second until August. Also advised patient to work on finding a PCP. Patient agreed. Patient voice appreciation for advise. Sheena Wallace

## 2019-10-13 ENCOUNTER — Other Ambulatory Visit: Payer: Self-pay

## 2019-10-13 ENCOUNTER — Other Ambulatory Visit: Payer: BC Managed Care – PPO

## 2019-10-13 ENCOUNTER — Other Ambulatory Visit (HOSPITAL_COMMUNITY)
Admission: RE | Admit: 2019-10-13 | Discharge: 2019-10-13 | Disposition: A | Discharge status: Home / Self Care | Payer: BC Managed Care – PPO | Source: Ambulatory Visit | Attending: Internal Medicine | Admitting: Internal Medicine

## 2019-10-13 DIAGNOSIS — Z79899 Other long term (current) drug therapy: Secondary | ICD-10-CM

## 2019-10-13 DIAGNOSIS — B20 Human immunodeficiency virus [HIV] disease: Secondary | ICD-10-CM

## 2019-10-13 DIAGNOSIS — Z113 Encounter for screening for infections with a predominantly sexual mode of transmission: Secondary | ICD-10-CM

## 2019-10-14 LAB — T-HELPER CELL (CD4) - (RCID CLINIC ONLY)
CD4 % Helper T Cell: 32 % — ABNORMAL LOW (ref 33–65)
CD4 T Cell Abs: 647 /uL (ref 400–1790)

## 2019-10-14 LAB — URINE CYTOLOGY ANCILLARY ONLY
Chlamydia: NEGATIVE
Comment: NEGATIVE
Comment: NORMAL
Neisseria Gonorrhea: NEGATIVE

## 2019-10-16 ENCOUNTER — Other Ambulatory Visit: Payer: Self-pay | Admitting: Internal Medicine

## 2019-10-16 DIAGNOSIS — B20 Human immunodeficiency virus [HIV] disease: Secondary | ICD-10-CM

## 2019-10-17 LAB — CBC WITH DIFFERENTIAL/PLATELET
Absolute Monocytes: 318 cells/uL (ref 200–950)
Basophils Absolute: 30 cells/uL (ref 0–200)
Basophils Relative: 0.8 %
Eosinophils Absolute: 70 cells/uL (ref 15–500)
Eosinophils Relative: 1.9 %
HCT: 38.9 % (ref 35.0–45.0)
Hemoglobin: 13 g/dL (ref 11.7–15.5)
Lymphs Abs: 2190 cells/uL (ref 850–3900)
MCH: 33.2 pg — ABNORMAL HIGH (ref 27.0–33.0)
MCHC: 33.4 g/dL (ref 32.0–36.0)
MCV: 99.5 fL (ref 80.0–100.0)
MPV: 11.7 fL (ref 7.5–12.5)
Monocytes Relative: 8.6 %
Neutro Abs: 1092 cells/uL — ABNORMAL LOW (ref 1500–7800)
Neutrophils Relative %: 29.5 %
Platelets: 276 10*3/uL (ref 140–400)
RBC: 3.91 10*6/uL (ref 3.80–5.10)
RDW: 12 % (ref 11.0–15.0)
Total Lymphocyte: 59.2 %
WBC: 3.7 10*3/uL — ABNORMAL LOW (ref 3.8–10.8)

## 2019-10-17 LAB — COMPREHENSIVE METABOLIC PANEL
AG Ratio: 1.3 (calc) (ref 1.0–2.5)
ALT: 8 U/L (ref 6–29)
AST: 17 U/L (ref 10–30)
Albumin: 4.4 g/dL (ref 3.6–5.1)
Alkaline phosphatase (APISO): 76 U/L (ref 31–125)
BUN/Creatinine Ratio: 11 (calc) (ref 6–22)
BUN: 14 mg/dL (ref 7–25)
CO2: 25 mmol/L (ref 20–32)
Calcium: 9.7 mg/dL (ref 8.6–10.2)
Chloride: 105 mmol/L (ref 98–110)
Creat: 1.22 mg/dL — ABNORMAL HIGH (ref 0.50–1.10)
Globulin: 3.4 g/dL (calc) (ref 1.9–3.7)
Glucose, Bld: 84 mg/dL (ref 65–99)
Potassium: 4.6 mmol/L (ref 3.5–5.3)
Sodium: 138 mmol/L (ref 135–146)
Total Bilirubin: 0.4 mg/dL (ref 0.2–1.2)
Total Protein: 7.8 g/dL (ref 6.1–8.1)

## 2019-10-17 LAB — LIPID PANEL
Cholesterol: 211 mg/dL — ABNORMAL HIGH (ref <200)
HDL: 65 mg/dL (ref >=50)
LDL Cholesterol (Calc): 126 mg/dL (calc) — ABNORMAL HIGH
Non-HDL Cholesterol (Calc): 146 mg/dL (calc) — ABNORMAL HIGH (ref <130)
Total CHOL/HDL Ratio: 3.2 (calc) (ref <5.0)
Triglycerides: 94 mg/dL (ref <150)

## 2019-10-17 LAB — HIV-1 RNA QUANT-NO REFLEX-BLD
HIV 1 RNA Quant: 22 Copies/mL — ABNORMAL HIGH
HIV-1 RNA Quant, Log: 1.33 Log cps/mL — ABNORMAL HIGH

## 2019-10-17 LAB — RPR: RPR Ser Ql: NONREACTIVE

## 2019-11-10 ENCOUNTER — Ambulatory Visit (INDEPENDENT_AMBULATORY_CARE_PROVIDER_SITE_OTHER): Payer: BC Managed Care – PPO | Admitting: Internal Medicine

## 2019-11-10 ENCOUNTER — Encounter: Payer: Self-pay | Admitting: Internal Medicine

## 2019-11-10 ENCOUNTER — Ambulatory Visit
Admission: RE | Admit: 2019-11-10 | Discharge: 2019-11-10 | Disposition: A | Discharge status: Home / Self Care | Payer: BC Managed Care – PPO | Source: Ambulatory Visit | Attending: Internal Medicine | Admitting: Internal Medicine

## 2019-11-10 ENCOUNTER — Other Ambulatory Visit: Payer: Self-pay

## 2019-11-10 VITALS — BP 113/75 | HR 68 | Wt 149.0 lb

## 2019-11-10 DIAGNOSIS — Z79899 Other long term (current) drug therapy: Secondary | ICD-10-CM | POA: Diagnosis not present

## 2019-11-10 DIAGNOSIS — Z3169 Encounter for other general counseling and advice on procreation: Secondary | ICD-10-CM

## 2019-11-10 DIAGNOSIS — B20 Human immunodeficiency virus [HIV] disease: Secondary | ICD-10-CM

## 2019-11-10 DIAGNOSIS — R634 Abnormal weight loss: Secondary | ICD-10-CM
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
## 2020-02-11 ENCOUNTER — Other Ambulatory Visit: Payer: Self-pay | Admitting: Internal Medicine

## 2020-02-11 DIAGNOSIS — B20 Human immunodeficiency virus [HIV] disease: Secondary | ICD-10-CM

## 2020-02-17 ENCOUNTER — Telehealth: Payer: Self-pay

## 2020-02-17 NOTE — Telephone Encounter (Signed)
Left patient a voice mail to call back to schedule an appointment with Dr. Drue Second, first available

## 2020-02-17 NOTE — Telephone Encounter (Signed)
Per Dr Drue Second, patient reports she is pregnant. She needs an appointment asap, needs to adjust her medication.

## 2020-03-04 ENCOUNTER — Other Ambulatory Visit: Payer: Self-pay

## 2020-03-04 DIAGNOSIS — B20 Human immunodeficiency virus [HIV] disease: Secondary | ICD-10-CM

## 2020-03-08 ENCOUNTER — Other Ambulatory Visit: Payer: Self-pay

## 2020-03-08 ENCOUNTER — Other Ambulatory Visit: Payer: BC Managed Care – PPO

## 2020-03-08 DIAGNOSIS — B20 Human immunodeficiency virus [HIV] disease: Secondary | ICD-10-CM

## 2020-03-09 LAB — T-HELPER CELL (CD4) - (RCID CLINIC ONLY)
CD4 % Helper T Cell: 33 % (ref 33–65)
CD4 T Cell Abs: 474 /uL (ref 400–1790)

## 2020-03-19 LAB — COMPLETE METABOLIC PANEL WITH GFR
AG Ratio: 1.4 (calc) (ref 1.0–2.5)
ALT: 7 U/L (ref 6–29)
AST: 13 U/L (ref 10–30)
Albumin: 4 g/dL (ref 3.6–5.1)
Alkaline phosphatase (APISO): 51 U/L (ref 31–125)
BUN: 7 mg/dL (ref 7–25)
CO2: 21 mmol/L (ref 20–32)
Calcium: 9.3 mg/dL (ref 8.6–10.2)
Chloride: 106 mmol/L (ref 98–110)
Creat: 0.7 mg/dL (ref 0.50–1.10)
GFR, Est African American: 139 mL/min/{1.73_m2} (ref >=60)
GFR, Est Non African American: 120 mL/min/{1.73_m2} (ref >=60)
Globulin: 2.8 g/dL (calc) (ref 1.9–3.7)
Glucose, Bld: 65 mg/dL (ref 65–99)
Potassium: 3.7 mmol/L (ref 3.5–5.3)
Sodium: 136 mmol/L (ref 135–146)
Total Bilirubin: 0.3 mg/dL (ref 0.2–1.2)
Total Protein: 6.8 g/dL (ref 6.1–8.1)

## 2020-03-19 LAB — CBC WITH DIFFERENTIAL/PLATELET
Absolute Monocytes: 333 cells/uL (ref 200–950)
Basophils Absolute: 41 cells/uL (ref 0–200)
Basophils Relative: 1.2 %
Eosinophils Absolute: 92 cells/uL (ref 15–500)
Eosinophils Relative: 2.7 %
HCT: 33.8 % — ABNORMAL LOW (ref 35.0–45.0)
Hemoglobin: 11.5 g/dL — ABNORMAL LOW (ref 11.7–15.5)
Lymphs Abs: 1547 cells/uL (ref 850–3900)
MCH: 33.7 pg — ABNORMAL HIGH (ref 27.0–33.0)
MCHC: 34 g/dL (ref 32.0–36.0)
MCV: 99.1 fL (ref 80.0–100.0)
MPV: 11.8 fL (ref 7.5–12.5)
Monocytes Relative: 9.8 %
Neutro Abs: 1387 cells/uL — ABNORMAL LOW (ref 1500–7800)
Neutrophils Relative %: 40.8 %
Platelets: 226 10*3/uL (ref 140–400)
RBC: 3.41 10*6/uL — ABNORMAL LOW (ref 3.80–5.10)
RDW: 11.8 % (ref 11.0–15.0)
Total Lymphocyte: 45.5 %
WBC: 3.4 10*3/uL — ABNORMAL LOW (ref 3.8–10.8)

## 2020-03-19 LAB — HIV-1 RNA QUANT-NO REFLEX-BLD
HIV 1 RNA Quant: <20 Copies/mL — ABNORMAL HIGH
HIV-1 RNA Quant, Log: <1.3 Log cps/mL — ABNORMAL HIGH

## 2020-03-22 ENCOUNTER — Encounter: Payer: BC Managed Care – PPO | Admitting: Internal Medicine

## 2020-04-19 ENCOUNTER — Encounter: Payer: BC Managed Care – PPO | Admitting: Internal Medicine

## 2020-05-17 ENCOUNTER — Ambulatory Visit: Payer: BC Managed Care – PPO | Admitting: Internal Medicine

## 2020-05-18 ENCOUNTER — Ambulatory Visit (INDEPENDENT_AMBULATORY_CARE_PROVIDER_SITE_OTHER): Payer: BC Managed Care – PPO | Admitting: Internal Medicine

## 2020-05-18 ENCOUNTER — Encounter: Payer: Self-pay | Admitting: Internal Medicine

## 2020-05-18 ENCOUNTER — Other Ambulatory Visit: Payer: Self-pay

## 2020-05-18 ENCOUNTER — Telehealth: Payer: Self-pay | Admitting: *Deleted

## 2020-05-18 VITALS — BP 112/75 | HR 82 | Wt 157.0 lb

## 2020-05-18 DIAGNOSIS — B20 Human immunodeficiency virus [HIV] disease: Secondary | ICD-10-CM

## 2020-05-18 DIAGNOSIS — Z79899 Other long term (current) drug therapy: Secondary | ICD-10-CM

## 2020-05-18 DIAGNOSIS — Z3490 Encounter for supervision of normal pregnancy, unspecified, unspecified trimester: Secondary | ICD-10-CM | POA: Diagnosis not present

## 2020-05-18 LAB — POCT URINE PREGNANCY: Preg Test, Ur: POSITIVE — AB

## 2020-05-18 MED ORDER — TRIUMEQ 600-50-300 MG PO TABS: 1.0000 | ORAL_TABLET | Freq: Every day | ORAL | 6 refills | Status: AC

## 2020-05-18 NOTE — Telephone Encounter (Signed)
Patient moving from [place] to [place]. Would be difficult to come to [facility] for OB appointments. Connecting patient to Francetta Found at [facility] for OB/GYN, pediatric ID. Jim's contact information: Phone [phone] Pager [phone] Cell [phone] Fax [phone]
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
## 2020-05-19 LAB — T-HELPER CELL (CD4) - (RCID CLINIC ONLY)
CD4 % Helper T Cell: 36 % (ref 33–65)
CD4 T Cell Abs: 544 /uL (ref 400–1790)

## 2020-05-19 NOTE — Telephone Encounter (Signed)
Irving Burton, patient navigator with OB @ [facility] contacted office for additional patient information. Stated she will be reaching out to patient today. Provided phone number for her and gave her FOB name Kirstie Peri).   Holmes Hays Loyola Mast, RN

## 2020-05-19 NOTE — Telephone Encounter (Signed)
Faxed patient information to [place] at [place].  Sandie Ano, RN

## 2020-05-20 LAB — COMPLETE METABOLIC PANEL WITH GFR
AG Ratio: 1.3 (calc) (ref 1.0–2.5)
ALT: 6 U/L (ref 6–29)
AST: 13 U/L (ref 10–30)
Albumin: 3.8 g/dL (ref 3.6–5.1)
Alkaline phosphatase (APISO): 49 U/L (ref 31–125)
BUN/Creatinine Ratio: 10 (calc) (ref 6–22)
BUN: 6 mg/dL — ABNORMAL LOW (ref 7–25)
CO2: 23 mmol/L (ref 20–32)
Calcium: 9.2 mg/dL (ref 8.6–10.2)
Chloride: 106 mmol/L (ref 98–110)
Creat: 0.59 mg/dL (ref 0.50–1.10)
GFR, Est African American: 146 mL/min/{1.73_m2} (ref >=60)
GFR, Est Non African American: 126 mL/min/{1.73_m2} (ref >=60)
Globulin: 2.9 g/dL (calc) (ref 1.9–3.7)
Glucose, Bld: 70 mg/dL (ref 65–99)
Potassium: 3.8 mmol/L (ref 3.5–5.3)
Sodium: 137 mmol/L (ref 135–146)
Total Bilirubin: 0.3 mg/dL (ref 0.2–1.2)
Total Protein: 6.7 g/dL (ref 6.1–8.1)

## 2020-05-20 LAB — CBC WITH DIFFERENTIAL/PLATELET
Absolute Monocytes: 332 cells/uL (ref 200–950)
Basophils Absolute: 21 cells/uL (ref 0–200)
Basophils Relative: 0.5 %
Eosinophils Absolute: 92 cells/uL (ref 15–500)
Eosinophils Relative: 2.2 %
HCT: 35 % (ref 35.0–45.0)
Hemoglobin: 11.8 g/dL (ref 11.7–15.5)
Lymphs Abs: 1558 cells/uL (ref 850–3900)
MCH: 33.8 pg — ABNORMAL HIGH (ref 27.0–33.0)
MCHC: 33.7 g/dL (ref 32.0–36.0)
MCV: 100.3 fL — ABNORMAL HIGH (ref 80.0–100.0)
MPV: 12 fL (ref 7.5–12.5)
Monocytes Relative: 7.9 %
Neutro Abs: 2197 cells/uL (ref 1500–7800)
Neutrophils Relative %: 52.3 %
Platelets: 211 10*3/uL (ref 140–400)
RBC: 3.49 10*6/uL — ABNORMAL LOW (ref 3.80–5.10)
RDW: 12.5 % (ref 11.0–15.0)
Total Lymphocyte: 37.1 %
WBC: 4.2 10*3/uL (ref 3.8–10.8)

## 2020-05-20 LAB — RUBELLA SCREEN: Rubella: <0.9 Index — ABNORMAL LOW

## 2020-05-20 LAB — HEMOGLOBIN A1C
Hgb A1c MFr Bld: 4.8 % of total Hgb (ref <5.7)
Mean Plasma Glucose: 91 mg/dL
eAG (mmol/L): 5 mmol/L

## 2020-05-20 LAB — RPR: RPR Ser Ql: NONREACTIVE

## 2020-05-20 LAB — HIV-1 RNA QUANT-NO REFLEX-BLD
HIV 1 RNA Quant: 54 Copies/mL — ABNORMAL HIGH
HIV-1 RNA Quant, Log: 1.74 Log cps/mL — ABNORMAL HIGH

## 2020-05-20 LAB — HCG, SERUM, QUALITATIVE: Preg, Serum: POSITIVE — AB

## 2020-05-20 LAB — HEPATITIS B SURFACE ANTIGEN: Hepatitis B Surface Ag: NONREACTIVE

## 2020-07-14 ENCOUNTER — Telehealth: Payer: Self-pay

## 2020-07-14 NOTE — Telephone Encounter (Signed)
Attempted to call patient to regarding appointment that is scheduled for 5/9 for labs. Per chart patient has transferred care to [facility]. Patient will not need to keep visits with [facility]. Cancelled appt for labs and follow up with our office. Not able to leave voicemail for patient with update. Will need patient to schedule follow up with [facility] going further.  Juanita Laster, RMA

## 2020-07-19 ENCOUNTER — Other Ambulatory Visit: Payer: BC Managed Care – PPO

## 2020-08-02 ENCOUNTER — Encounter: Payer: BC Managed Care – PPO | Admitting: Internal Medicine

## 2020-12-24 IMAGING — CR DG CHEST 2V
2 series · 2 of 2 positions shown · non-contrast
Comparison: 05/20/2014

CLINICAL DATA: Fever, unintentional weight loss

EXAM:
CHEST - 2 VIEW

[w chest pa]
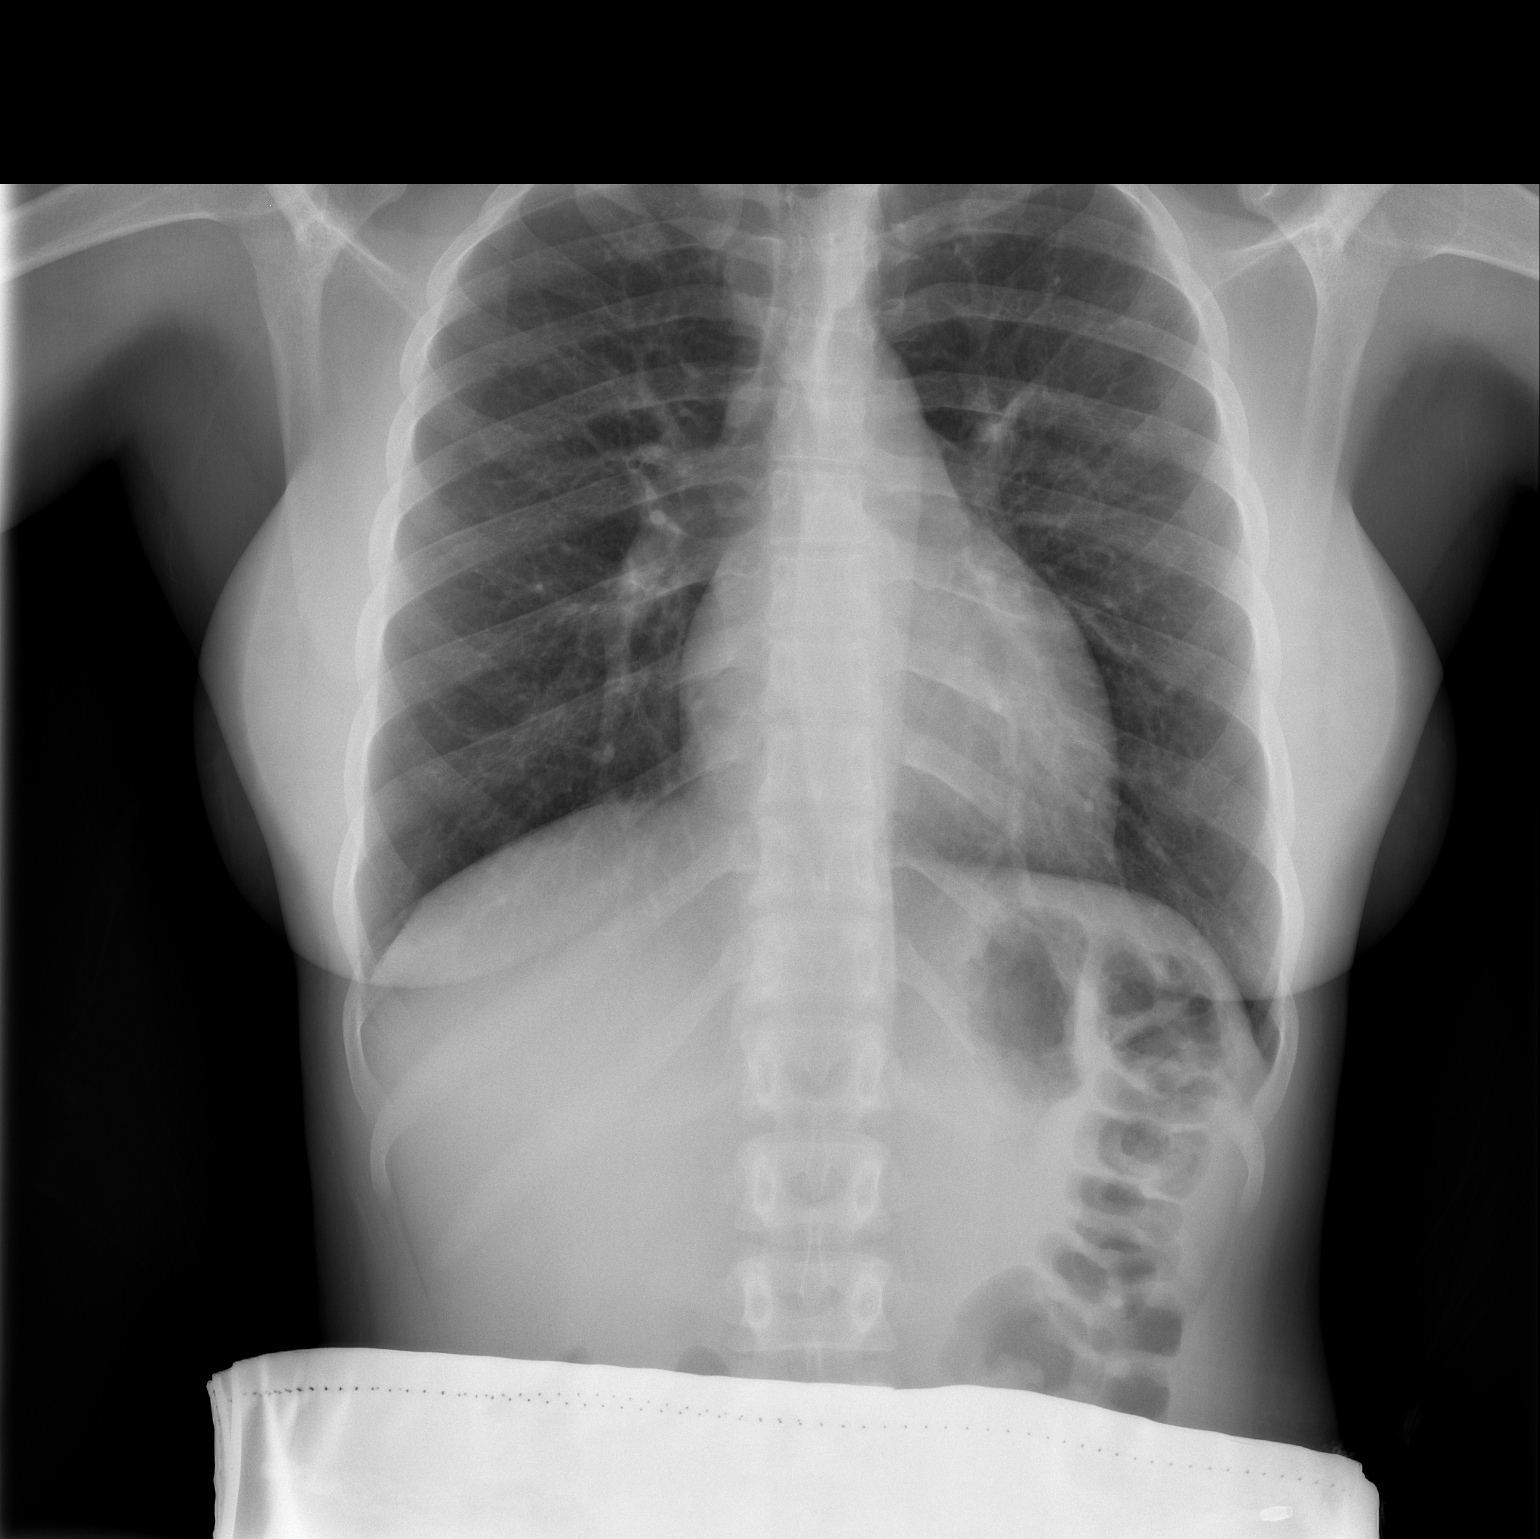

[w chest lat]
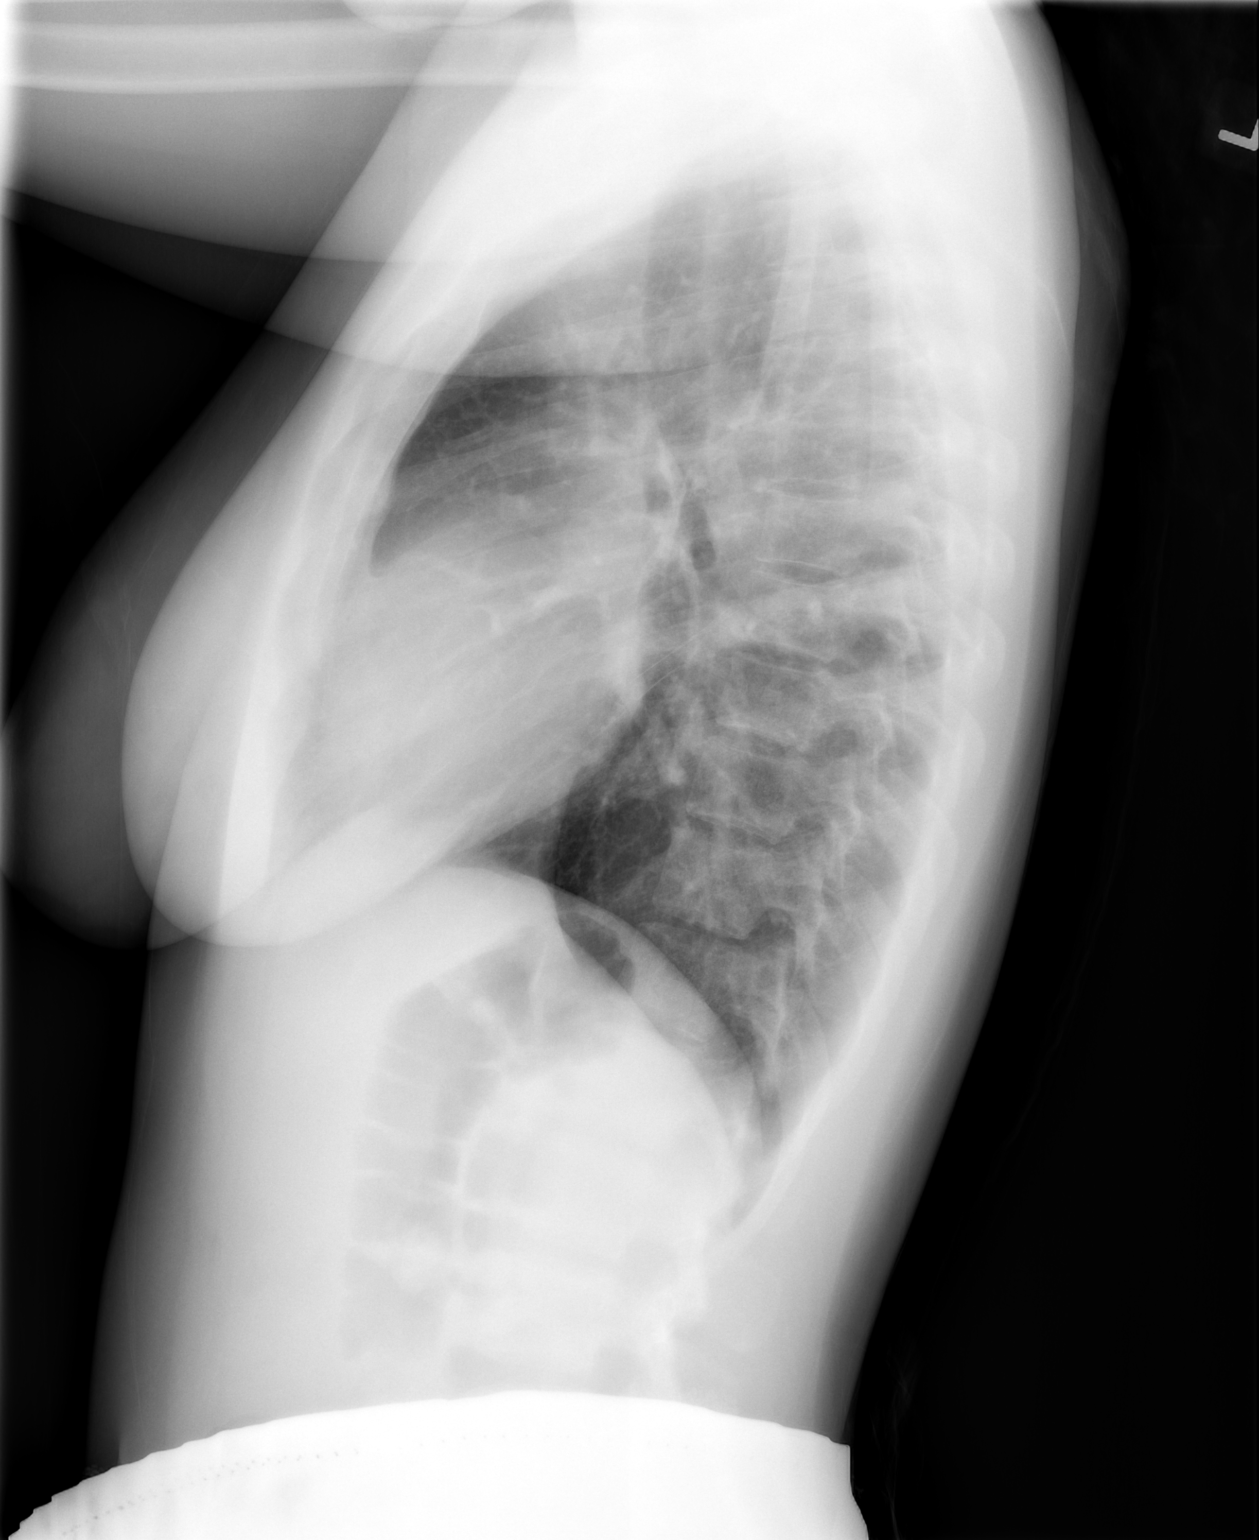

[2 of 2 positions shown; findings below may reference images not displayed]

FINDINGS: The heart size and mediastinal contours are within normal limits.
Both lungs are clear. The visualized skeletal structures are
unremarkable.
IMPRESSION: No active cardiopulmonary disease.

## 2022-01-02 ENCOUNTER — Ambulatory Visit: Payer: BC Managed Care – PPO | Admitting: *Deleted

## 2022-01-02 VITALS — BP 107/74 | HR 76 | Ht 64.0 in | Wt 173.1 lb

## 2022-01-02 DIAGNOSIS — O98719 Human immunodeficiency virus [HIV] disease complicating pregnancy, unspecified trimester: Secondary | ICD-10-CM | POA: Insufficient documentation

## 2022-01-02 DIAGNOSIS — O0930 Supervision of pregnancy with insufficient antenatal care, unspecified trimester: Secondary | ICD-10-CM

## 2022-01-02 DIAGNOSIS — K5900 Constipation, unspecified: Secondary | ICD-10-CM

## 2022-01-02 DIAGNOSIS — O0990 Supervision of high risk pregnancy, unspecified, unspecified trimester: Secondary | ICD-10-CM

## 2022-01-02 MED ORDER — BLOOD PRESSURE KIT DEVI: 1.0000 | 0 refills | Status: AC

## 2022-01-02 MED ORDER — DOCUSATE SODIUM 100 MG PO CAPS: 100.0000 mg | ORAL_CAPSULE | Freq: Two times a day (BID) | ORAL | 0 refills | Status: DC

## 2022-01-02 MED ORDER — POLYETHYLENE GLYCOL 3350 17 GM/SCOOP PO POWD: 1.0000 | Freq: Once | ORAL | 0 refills | Status: AC

## 2022-01-02 MED ORDER — POLYETHYLENE GLYCOL 3350 17 GM/SCOOP PO POWD: 1.0000 | Freq: Once | ORAL | Status: DC

## 2022-01-02 NOTE — Progress Notes (Signed)
Discussed HIV + and late to care with Dr. Elly Modena. Pt counseled to continue care with Infectious Disease provider and continue antiviral medications. New OB appt and OB Detail [place] scheduled at check out.
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
## 2022-01-23 ENCOUNTER — Ambulatory Visit: Payer: BC Managed Care – PPO | Admitting: *Deleted

## 2022-01-23 ENCOUNTER — Other Ambulatory Visit: Payer: Self-pay | Admitting: *Deleted

## 2022-01-23 ENCOUNTER — Ambulatory Visit: Payer: BC Managed Care – PPO | Attending: Obstetrics and Gynecology

## 2022-01-23 ENCOUNTER — Encounter: Payer: Self-pay | Admitting: *Deleted

## 2022-01-23 VITALS — BP 106/65 | HR 90

## 2022-01-23 DIAGNOSIS — Z362 Encounter for other antenatal screening follow-up: Secondary | ICD-10-CM

## 2022-01-23 DIAGNOSIS — O98719 Human immunodeficiency virus [HIV] disease complicating pregnancy, unspecified trimester: Secondary | ICD-10-CM | POA: Insufficient documentation

## 2022-01-23 DIAGNOSIS — O0990 Supervision of high risk pregnancy, unspecified, unspecified trimester: Secondary | ICD-10-CM | POA: Diagnosis present

## 2022-01-23 DIAGNOSIS — O0932 Supervision of pregnancy with insufficient antenatal care, second trimester: Secondary | ICD-10-CM

## 2022-01-23 DIAGNOSIS — O09892 Supervision of other high risk pregnancies, second trimester: Secondary | ICD-10-CM

## 2022-02-13 ENCOUNTER — Telehealth: Payer: Self-pay | Admitting: *Deleted

## 2022-02-13 NOTE — Telephone Encounter (Signed)
TC from pt reporting having difficulties at work. Reports back pain and need for accommodations. Pt iis 24.4 wks, has had OB US/ Intake but not provider visit. Scheduled 02/20/22 for New OB appt. Advised pt to seek accommodations paperwork from employer. Advised pt to speak with provider about difficulties and accommodations needed. Advised we can provide a letter with the provider's recommendations or fill out formal paperwork. Pt verbalized understanding.

## 2022-02-20 ENCOUNTER — Ambulatory Visit (INDEPENDENT_AMBULATORY_CARE_PROVIDER_SITE_OTHER): Payer: BC Managed Care – PPO | Admitting: Advanced Practice Midwife

## 2022-02-20 ENCOUNTER — Encounter: Payer: Self-pay | Admitting: Advanced Practice Midwife

## 2022-02-20 VITALS — BP 110/68 | HR 95 | Wt 188.0 lb

## 2022-02-20 DIAGNOSIS — Z3A25 25 weeks gestation of pregnancy: Secondary | ICD-10-CM

## 2022-02-20 DIAGNOSIS — O0992 Supervision of high risk pregnancy, unspecified, second trimester: Secondary | ICD-10-CM

## 2022-02-20 DIAGNOSIS — O98719 Human immunodeficiency virus [HIV] disease complicating pregnancy, unspecified trimester: Secondary | ICD-10-CM

## 2022-02-20 DIAGNOSIS — O99891 Other specified diseases and conditions complicating pregnancy: Secondary | ICD-10-CM

## 2022-02-20 DIAGNOSIS — O0990 Supervision of high risk pregnancy, unspecified, unspecified trimester: Secondary | ICD-10-CM

## 2022-02-20 DIAGNOSIS — O98712 Human immunodeficiency virus [HIV] disease complicating pregnancy, second trimester: Secondary | ICD-10-CM

## 2022-02-20 DIAGNOSIS — M549 Dorsalgia, unspecified: Secondary | ICD-10-CM

## 2022-02-20 NOTE — Progress Notes (Signed)
Patient presents for ROB. Patient complains of having lower back pain and swelling in her feet. Patient advised to purchase a maternity support belt.
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
## 2022-02-21 LAB — CBC/D/PLT+RPR+RH+ABO+RUBIGG...
Antibody Screen: NEGATIVE
Basophils Absolute: 0 10*3/uL (ref 0.0–0.2)
Basos: 1 %
EOS (ABSOLUTE): 0.1 10*3/uL (ref 0.0–0.4)
Eos: 1 %
HCV Ab: NONREACTIVE
HIV Screen 4th Generation wRfx: REACTIVE
Hematocrit: 35.7 % (ref 34.0–46.6)
Hemoglobin: 11.9 g/dL (ref 11.1–15.9)
Hepatitis B Surface Ag: NEGATIVE
Immature Grans (Abs): 0 10*3/uL (ref 0.0–0.1)
Immature Granulocytes: 0 %
Lymphocytes Absolute: 1.6 10*3/uL (ref 0.7–3.1)
Lymphs: 31 %
MCH: 34.4 pg — ABNORMAL HIGH (ref 26.6–33.0)
MCHC: 33.3 g/dL (ref 31.5–35.7)
MCV: 103 fL — ABNORMAL HIGH (ref 79–97)
Monocytes Absolute: 0.4 10*3/uL (ref 0.1–0.9)
Monocytes: 8 %
Neutrophils Absolute: 3 10*3/uL (ref 1.4–7.0)
Neutrophils: 59 %
Platelets: 224 10*3/uL (ref 150–450)
RBC: 3.46 x10E6/uL — ABNORMAL LOW (ref 3.77–5.28)
RDW: 13 % (ref 11.7–15.4)
RPR Ser Ql: NONREACTIVE
Rh Factor: POSITIVE
Rubella Antibodies, IGG: <0.9 index — ABNORMAL LOW (ref >0.99)
WBC: 5.1 10*3/uL (ref 3.4–10.8)

## 2022-02-21 LAB — HEMOGLOBIN A1C
Est. average glucose Bld gHb Est-mCnc: 100 mg/dL
Hgb A1c MFr Bld: 5.1 % (ref 4.8–5.6)

## 2022-02-21 LAB — HCV INTERPRETATION

## 2022-02-21 LAB — HIV 1/2 AB DIFFERENTIATION
HIV 1 Ab: REACTIVE
HIV 2 Ab: NONREACTIVE
NOTE (HIV CONF MULTIP: POSITIVE — AB

## 2022-02-22 ENCOUNTER — Encounter: Payer: Self-pay | Admitting: Advanced Practice Midwife

## 2022-02-23 ENCOUNTER — Encounter: Payer: Self-pay | Admitting: Obstetrics

## 2022-02-23 ENCOUNTER — Encounter: Payer: Self-pay | Admitting: Advanced Practice Midwife

## 2022-02-25 LAB — PANORAMA PRENATAL TEST FULL PANEL:PANORAMA TEST PLUS 5 ADDITIONAL MICRODELETIONS: FETAL FRACTION: 12.5

## 2022-02-26 LAB — HORIZON CUSTOM: REPORT SUMMARY: NEGATIVE

## 2022-02-27 ENCOUNTER — Other Ambulatory Visit: Payer: Self-pay | Admitting: *Deleted

## 2022-02-27 ENCOUNTER — Ambulatory Visit: Payer: BC Managed Care – PPO | Attending: Obstetrics and Gynecology

## 2022-02-27 ENCOUNTER — Ambulatory Visit: Payer: BC Managed Care – PPO

## 2022-02-27 ENCOUNTER — Ambulatory Visit: Payer: BC Managed Care – PPO | Admitting: *Deleted

## 2022-02-27 ENCOUNTER — Ambulatory Visit (HOSPITAL_BASED_OUTPATIENT_CLINIC_OR_DEPARTMENT_OTHER): Payer: BC Managed Care – PPO | Admitting: Maternal & Fetal Medicine

## 2022-02-27 ENCOUNTER — Other Ambulatory Visit: Payer: Self-pay

## 2022-02-27 VITALS — BP 100/62 | HR 73

## 2022-02-27 DIAGNOSIS — O0990 Supervision of high risk pregnancy, unspecified, unspecified trimester: Secondary | ICD-10-CM

## 2022-02-27 DIAGNOSIS — O0930 Supervision of pregnancy with insufficient antenatal care, unspecified trimester: Secondary | ICD-10-CM | POA: Diagnosis present

## 2022-02-27 DIAGNOSIS — Z362 Encounter for other antenatal screening follow-up: Secondary | ICD-10-CM | POA: Insufficient documentation

## 2022-02-27 DIAGNOSIS — B20 Human immunodeficiency virus [HIV] disease: Secondary | ICD-10-CM

## 2022-02-27 DIAGNOSIS — O98512 Other viral diseases complicating pregnancy, second trimester: Secondary | ICD-10-CM

## 2022-02-27 DIAGNOSIS — O09892 Supervision of other high risk pregnancies, second trimester: Secondary | ICD-10-CM | POA: Insufficient documentation

## 2022-02-27 DIAGNOSIS — O0932 Supervision of pregnancy with insufficient antenatal care, second trimester: Secondary | ICD-10-CM | POA: Insufficient documentation

## 2022-02-27 DIAGNOSIS — O98719 Human immunodeficiency virus [HIV] disease complicating pregnancy, unspecified trimester: Secondary | ICD-10-CM | POA: Diagnosis present

## 2022-02-27 DIAGNOSIS — Z3A26 26 weeks gestation of pregnancy: Secondary | ICD-10-CM

## 2022-02-27 DIAGNOSIS — O98712 Human immunodeficiency virus [HIV] disease complicating pregnancy, second trimester: Secondary | ICD-10-CM

## 2022-02-27 MED ORDER — ONDANSETRON 8 MG PO TBDP: 8.0000 mg | ORAL_TABLET | Freq: Three times a day (TID) | ORAL | 1 refills | Status: DC | PRN

## 2022-02-27 NOTE — Progress Notes (Signed)
MFM Consult Note Patient Name: Sheena Wallace  Patient MRN:   [id number]  Referring provider: Merla Riches  Reason for Consult: HIV   HPI: Sheena Wallace is a 26 y.o. G2P1001 at [redacted]w[redacted]d here for ultrasound and consultation.   Sheena Wallace diagnosed with HIV at birth and has been mostly adherent to the combined antiretroviral therapy (cART) prescribed (Triumeq-abacavir, dolutegravir, and lamivudine) by her infections disease specialist, Dr. RMarlinda Mike([facility]. Her most recent HIV viral load is <20 copies/mL with a CD4 count of 594/uL. She does miss some doses due to nausea on occasion.   I discussed the complications associated with HIV during pregnancy. cART with suppressed viral load, vertical transmission to the fetus (congenital HIV infection) is very low, <1-2%. With a low viral load (<1000 copies/mL), cesarean does not further reduce vertical transmission rate; therefore in cases of suppressed viral load, vaginal delivery is preferred as long as not contraindications exit. In patients with HIV RNA >1000 copies/mL near delivery we recommended cesarean at 322gestational weeks to reduce vertical transmission in addition to pre-delivery AZT for these patients.   Current recommendations regarding use of intrapartum/peripartum IV AZT include: - Recommended: HIV RNA >1,000 copies/mL (or unknown HIV RNA) near delivery or for women with poor adherence to ART regardless of viral load.  - Not required: for women receiving ART regimens who have HIV RNA ?50 copies/mL during late pregnancy and near delivery and no concerns regarding adherence to the ART regimen.  - Considered: for women with HIV RNA between 50 and 999 copies/mL. There are inadequate data to determine whether administration of IV zidovudine to women with HIV RNA levels between 50 and 999 copies/mL provides any additional protection against perinatal transmission. However, some experts would administer IV zidovudine to women with RNA levels  in this range, as the transmission risk is slightly higher when HIV RNA is in the range of 50 and 999 copies/mL compared to <50 copies/mL (CII).  If intrapartum IV AZT is prescribed it should be dosed as a 250mkg loading dose in the 1st hour followed by 1 mg/kg/hour continuous infusion until delivery. Three hours of IV AZT is recommended prior to cesarean.   We discussed the various obstetric complications with HIV in pregnancy. Some studies indicate an increased risk for fetal growth restriction (or low birth weight) or fetal death among HIV + mothers. However, since these findings are inconsistent fetal testing is not universally recommended. We discussed the importance of serial fetal growth ultrasounds and need to institute antenatal fetal testing if fetal growth restriction develops. Due to the above fetal risks, I recommend considering delivery at 39 gestational weeks if an indication does not arise prior.  Review of Systems: A review of systems was performed and was negative except per HPI   Vitals and Physical Exam See intake sheet for vitals Sitting comfortably on the sonogram table Nonlabored breathing Normal rate and rhythm Abdomen is nontender  Genetic testing: pending  Sonographic findings Single intrauterine pregnancy at 26w 4d. Observed fetal cardiac activity. Cephalic presentation. Interval fetal anatomy appears normal.  Fetal biometry shows the estimated fetal weight at the 28 percentile.  Amniotic fluid volume: Within normal limits. MVP: 3.7 cm. Placenta: Posterior.  Assessment/Plan HIV - Continue co-management of prenatal care with general OB/GYN and consultative care from MFM and ID. - Continue HIV medications. I messaged her provider to prescribe an antiemetic so she does not miss any doses of her meds - Gestational diabetes screening should be done if not  completed  - HIV specific lab work done by ID. Repeat a Viral load (HIV RNA) and CD4 cell counts per ID. This  is usually every trimester and then again 34-36 gestational weeks. - Ensure vaccines are up to date (pneumococcal, hepatitis B, hepatitis A, flu, MMR, varicella, HPV, tDAP)Ensure vaccines are up to date (pneumococcal, hepatitis B, hepatitis A, flu, MMR, varicella, HPV and covid-19). - Optimize comorbidities (smoking cessation, treatment for opiate use disorders, treatment for viral hepatitis [B or C], management of diabetes, hypertension, cervical HPV when appropriate). - Discuss disclosure of patient's HIV status to partner - the patient has done this and is not currently with her partner - Chemoprophylaxis for opportunistic infections per ID - there are no apparent indications for chemoprophylaxis at this time. - Delivery route and timing based on viral load. If the viral load is  >1000 copies/mL, a cesarean delivery is recommended at [redacted] weeks along with AZT 3 hours before delivery. If viral load is < 100 copies/mL, then intrapartum AZT and vaginal delivery are recommended as long as no other obstetric contraindication exit.  - Delivery can occur at [facility] since the neonatology department can support the above recommendations and follow-up of an HIV-exposed neonate. - Minimize fetal risk with intrapartum exposures: avoid early AROM and fetal scalp electrode if possible - Detailed ultrasound has been done. Continue serial growth [place] every 4 weeks or sooner if indicated.   I spent 45 minutes reviewing the patients chart, including labs and images as well as counseling the patient about her medical conditions.  Valeda Malm  MFM, [place]   02/27/2022  9:22 AM

## 2022-03-13 NOTE — L&D Delivery Note (Addendum)
Delivery Note Sheena Wallace is a 27 y.o. G2P1001 at [redacted]w[redacted]d admitted for IOL for HIV.   GBS Status:  Positive/-- (02/27 1306) Maximum Maternal Temperature: 83F  Labor course: Initial SVE: 1/50/-2. Augmentation with: cytotec. She then progressed to complete.  She received PCN 4 hours prior to delivery and AZT was started 2.5 hours prior to SROM / delivery.  ROM: 0h 70m with clear fluid  Birth: At 2005 a viable female was delivered via spontaneous vaginal delivery (Presentation: direct OA restituted to ROT ;  ). Nuchal cord present: Yes corrected with somersault after delivery of body.  Shoulders and body delivered in usual fashion. Infant placed directly on mom's abdomen for bonding/skin-to-skin, baby dried and stimulated. Cord clamped x 2 after several minute and cut by clinician.  Cord blood collected.  The placenta separated spontaneously and delivered via gentle cord traction.  Pitocin infused rapidly IV per protocol.  Fundus firm with massage.  Placenta inspected and appears to be intact with a 3 VC.  Placenta/Cord with the following complications: marginal cord insertion .    Sponge and instrument count were correct x2.  Intrapartum complications:  None Anesthesia:  none Episiotomy: none Lacerations:  small first degree perineal laceration that was hemostatic. Not repaired Suture Repair: none EBL (mL): 126cc   Infant: APGAR (1 MIN):  8 APGAR (5 MINS):  9 Infant weight: pending  Mom to postpartum.  Baby to Couplet care / Skin to Skin. Placenta to L&D   Plans to Bottlefeed Contraception: none Circumcision: N/A  Delivery was attended by Christin Fudge.  Jiayu "Darlyne Russian, M.D. PGY-2 Family Medicine Visiting Resident Faculty Practice 05/29/2022 9:41 PM   The above was performed under my direct supervision and guidance.

## 2022-03-14 ENCOUNTER — Encounter: Payer: Self-pay | Admitting: Medical

## 2022-03-14 ENCOUNTER — Ambulatory Visit (INDEPENDENT_AMBULATORY_CARE_PROVIDER_SITE_OTHER): Payer: 59 | Admitting: Medical

## 2022-03-14 ENCOUNTER — Other Ambulatory Visit: Payer: Medicaid Other

## 2022-03-14 VITALS — BP 105/68 | HR 77 | Wt 193.6 lb

## 2022-03-14 DIAGNOSIS — Z2839 Other underimmunization status: Secondary | ICD-10-CM | POA: Insufficient documentation

## 2022-03-14 DIAGNOSIS — Z3A28 28 weeks gestation of pregnancy: Secondary | ICD-10-CM

## 2022-03-14 DIAGNOSIS — O0990 Supervision of high risk pregnancy, unspecified, unspecified trimester: Secondary | ICD-10-CM

## 2022-03-14 DIAGNOSIS — O98713 Human immunodeficiency virus [HIV] disease complicating pregnancy, third trimester: Secondary | ICD-10-CM

## 2022-03-14 DIAGNOSIS — O0993 Supervision of high risk pregnancy, unspecified, third trimester: Secondary | ICD-10-CM | POA: Diagnosis not present

## 2022-03-14 DIAGNOSIS — Z23 Encounter for immunization: Secondary | ICD-10-CM | POA: Diagnosis not present

## 2022-03-14 DIAGNOSIS — N898 Other specified noninflammatory disorders of vagina: Secondary | ICD-10-CM

## 2022-03-14 DIAGNOSIS — O09893 Supervision of other high risk pregnancies, third trimester: Secondary | ICD-10-CM

## 2022-03-14 DIAGNOSIS — O0933 Supervision of pregnancy with insufficient antenatal care, third trimester: Secondary | ICD-10-CM

## 2022-03-14 MED ORDER — TERCONAZOLE 0.4 % VA CREA: 1.0000 | TOPICAL_CREAM | Freq: Every day | VAGINAL | 0 refills | Status: DC
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
## 2022-03-14 NOTE — Progress Notes (Signed)
Patient presents for [place] visit. Pt reports having more pain that usual. No other concerns at this time. TDaP given.

## 2022-03-15 LAB — GLUCOSE TOLERANCE, 2 HOURS W/ 1HR
Glucose, 1 hour: 110 mg/dL (ref 70–179)
Glucose, 2 hour: 83 mg/dL (ref 70–152)
Glucose, Fasting: 71 mg/dL (ref 70–91)

## 2022-03-15 LAB — CBC
Hematocrit: 32 % — ABNORMAL LOW (ref 34.0–46.6)
Hemoglobin: 10.9 g/dL — ABNORMAL LOW (ref 11.1–15.9)
MCH: 34.4 pg — ABNORMAL HIGH (ref 26.6–33.0)
MCHC: 34.1 g/dL (ref 31.5–35.7)
MCV: 101 fL — ABNORMAL HIGH (ref 79–97)
Platelets: 215 10*3/uL (ref 150–450)
RBC: 3.17 x10E6/uL — ABNORMAL LOW (ref 3.77–5.28)
RDW: 12.4 % (ref 11.7–15.4)
WBC: 5.7 10*3/uL (ref 3.4–10.8)

## 2022-03-15 LAB — RPR: RPR Ser Ql: NONREACTIVE

## 2022-03-16 LAB — URINE CULTURE, OB REFLEX

## 2022-03-16 LAB — CULTURE, OB URINE

## 2022-03-28 ENCOUNTER — Ambulatory Visit (INDEPENDENT_AMBULATORY_CARE_PROVIDER_SITE_OTHER): Payer: Medicaid Other | Admitting: Advanced Practice Midwife

## 2022-03-28 VITALS — BP 109/63 | HR 95 | Wt 195.6 lb

## 2022-03-28 DIAGNOSIS — Z3A30 30 weeks gestation of pregnancy: Secondary | ICD-10-CM

## 2022-03-28 DIAGNOSIS — O0990 Supervision of high risk pregnancy, unspecified, unspecified trimester: Secondary | ICD-10-CM

## 2022-03-28 DIAGNOSIS — O98713 Human immunodeficiency virus [HIV] disease complicating pregnancy, third trimester: Secondary | ICD-10-CM

## 2022-03-28 DIAGNOSIS — O0993 Supervision of high risk pregnancy, unspecified, third trimester: Secondary | ICD-10-CM
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
## 2022-04-03 ENCOUNTER — Ambulatory Visit: Payer: Medicaid Other

## 2022-04-03 ENCOUNTER — Ambulatory Visit (HOSPITAL_BASED_OUTPATIENT_CLINIC_OR_DEPARTMENT_OTHER): Payer: 59

## 2022-04-03 ENCOUNTER — Ambulatory Visit: Payer: 59 | Attending: Maternal & Fetal Medicine | Admitting: *Deleted

## 2022-04-03 VITALS — BP 119/57 | HR 79

## 2022-04-03 DIAGNOSIS — O98512 Other viral diseases complicating pregnancy, second trimester: Secondary | ICD-10-CM

## 2022-04-03 DIAGNOSIS — O0933 Supervision of pregnancy with insufficient antenatal care, third trimester: Secondary | ICD-10-CM

## 2022-04-03 DIAGNOSIS — Z21 Asymptomatic human immunodeficiency virus [HIV] infection status: Secondary | ICD-10-CM | POA: Diagnosis not present

## 2022-04-03 DIAGNOSIS — O0990 Supervision of high risk pregnancy, unspecified, unspecified trimester: Secondary | ICD-10-CM

## 2022-04-03 DIAGNOSIS — O98713 Human immunodeficiency virus [HIV] disease complicating pregnancy, third trimester: Secondary | ICD-10-CM

## 2022-04-03 DIAGNOSIS — Z3A31 31 weeks gestation of pregnancy: Secondary | ICD-10-CM | POA: Insufficient documentation

## 2022-04-03 DIAGNOSIS — B20 Human immunodeficiency virus [HIV] disease: Secondary | ICD-10-CM | POA: Diagnosis not present

## 2022-04-11 ENCOUNTER — Telehealth (INDEPENDENT_AMBULATORY_CARE_PROVIDER_SITE_OTHER): Payer: 59 | Admitting: Obstetrics and Gynecology

## 2022-04-11 DIAGNOSIS — O98713 Human immunodeficiency virus [HIV] disease complicating pregnancy, third trimester: Secondary | ICD-10-CM

## 2022-04-11 DIAGNOSIS — O0933 Supervision of pregnancy with insufficient antenatal care, third trimester: Secondary | ICD-10-CM

## 2022-04-11 DIAGNOSIS — Z2839 Other underimmunization status: Secondary | ICD-10-CM

## 2022-04-11 DIAGNOSIS — O0990 Supervision of high risk pregnancy, unspecified, unspecified trimester: Secondary | ICD-10-CM

## 2022-04-11 DIAGNOSIS — Z3A32 32 weeks gestation of pregnancy: Secondary | ICD-10-CM

## 2022-04-11 DIAGNOSIS — B20 Human immunodeficiency virus [HIV] disease: Secondary | ICD-10-CM

## 2022-04-11 DIAGNOSIS — O98719 Human immunodeficiency virus [HIV] disease complicating pregnancy, unspecified trimester: Secondary | ICD-10-CM

## 2022-04-11 DIAGNOSIS — O0930 Supervision of pregnancy with insufficient antenatal care, unspecified trimester: Secondary | ICD-10-CM
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
## 2022-04-11 NOTE — Progress Notes (Signed)
MyChart OB, Pt stated that she is a little busy now and is unable to check her BP.  Reports no concerns today.

## 2022-04-25 ENCOUNTER — Telehealth (INDEPENDENT_AMBULATORY_CARE_PROVIDER_SITE_OTHER): Payer: Medicaid Other | Admitting: Obstetrics and Gynecology

## 2022-04-25 ENCOUNTER — Telehealth: Payer: Medicaid Other | Admitting: Obstetrics and Gynecology

## 2022-04-25 DIAGNOSIS — O0990 Supervision of high risk pregnancy, unspecified, unspecified trimester: Secondary | ICD-10-CM

## 2022-04-25 DIAGNOSIS — O0993 Supervision of high risk pregnancy, unspecified, third trimester: Secondary | ICD-10-CM

## 2022-04-25 DIAGNOSIS — O98719 Human immunodeficiency virus [HIV] disease complicating pregnancy, unspecified trimester: Secondary | ICD-10-CM

## 2022-04-25 DIAGNOSIS — O98713 Human immunodeficiency virus [HIV] disease complicating pregnancy, third trimester: Secondary | ICD-10-CM

## 2022-04-25 DIAGNOSIS — Z3A34 34 weeks gestation of pregnancy: Secondary | ICD-10-CM

## 2022-04-25 NOTE — Progress Notes (Unsigned)
    TELEHEALTH OBSTETRICS VISIT ENCOUNTER NOTE  Provider location: [facility] for [place] [facility]   Patient location: Home  I connected with Murriel Hopper on 04/25/22 at  1:50 PM EST by telephone at home and verified that I am speaking with the correct person using two identifiers. Of note, unable to do video encounter due to technical difficulties.    I discussed the limitations, risks, security and privacy concerns of performing an evaluation and management service by telephone and the availability of in person appointments. I also discussed with the patient that there may be a patient responsible charge related to this service. The patient expressed understanding and agreed to proceed.  Subjective:  Sheena Wallace is a 27 y.o. G2P1001 at [redacted]w[redacted]d being followed for ongoing prenatal care.  She is currently monitored for the following issues for this {Blank single:19197::"high-risk","low-risk"} pregnancy and has Supervision of high risk pregnancy, antepartum; HIV disease affecting pregnancy; Late prenatal care affecting pregnancy; and Rubella non-immune status, antepartum on their problem list.  Patient reports {sx:[id number]}. Reports fetal movement. Denies any contractions, bleeding or leaking of fluid.   The following portions of the patient's history were reviewed and updated as appropriate: allergies, current medications, past family history, past medical history, past social history, past surgical history and problem list.   Objective:  Last menstrual period 08/25/2021, unknown if currently breastfeeding. General:  Alert, oriented and cooperative.   Mental Status: Normal mood and affect perceived. Normal judgment and thought content.  Rest of physical exam deferred due to type of encounter  Assessment and Plan:  Pregnancy: G2P1001 at [redacted]w[redacted]d 1. Supervision of high risk pregnancy, antepartum 2. [redacted] weeks gestation of pregnancy ***  3. HIV disease affecting pregnancy,  antepartum ***  4. Rubella non-immune status, antepartum ***  {Blank single:19197::"Term","Preterm"} labor symptoms and general obstetric precautions including but not limited to vaginal bleeding, contractions, leaking of fluid and fetal movement were reviewed in detail with the patient.  I discussed the assessment and treatment plan with the patient. The patient was provided an opportunity to ask questions and all were answered. The patient agreed with the plan and demonstrated an understanding of the instructions. The patient was advised to call back or seek an in-person office evaluation/go to MAU at [facility] for any urgent or concerning symptoms. Please refer to After Visit Summary for other counseling recommendations.   I provided *** minutes of non-face-to-face time during this encounter.  No follow-ups on file.  Future Appointments  Date Time Provider [place]  04/25/2022  1:50 PM Inez Catalina, MD [place] None  05/01/2022  8:30 AM [facility] NURSE [facility]  05/01/2022  8:45 AM [facility] US4 [facility]  05/09/2022 11:15 AM Leftwich-Kirby, Kathie Dike, CNM [facility] None    Inez Catalina, MD Center for [facility], [place]

## 2022-04-25 NOTE — Progress Notes (Signed)
    TELEHEALTH OBSTETRICS VISIT ENCOUNTER NOTE  Provider location: [facility] for [place] at [facility]   Patient location: Home  I connected with Sheena Wallace on 04/25/22 at  2:30 PM EST by telephone at home and verified that I am speaking with the correct person using two identifiers. Of note, unable to do video encounter due to technical difficulties.    I discussed the limitations, risks, security and privacy concerns of performing an evaluation and management service by telephone and the availability of in person appointments. I also discussed with the patient that there may be a patient responsible charge related to this service. The patient expressed understanding and agreed to proceed.  Subjective:  Sheena Wallace is a 27 y.o. G2P1001 at [redacted]w[redacted]d being followed for ongoing prenatal care.  She is currently monitored for the following issues for this high-risk pregnancy and has Supervision of high risk pregnancy, antepartum; HIV disease affecting pregnancy; Late prenatal care affecting pregnancy; and Rubella non-immune status, antepartum on their problem list.  Patient reports BH ctx. Vulvar itching. Reports fetal movement. Denies any bleeding or leaking of fluid.   The following portions of the patient's history were reviewed and updated as appropriate: allergies, current medications, past family history, past medical history, past social history, past surgical history and problem list.   Objective:  Last menstrual period 08/25/2021, unknown if currently breastfeeding. General:  Alert, oriented and cooperative.   Mental Status: Normal mood and affect perceived. Normal judgment and thought content.  Rest of physical exam deferred due to type of encounter  Assessment and Plan:  Pregnancy: G2P1001 at [redacted]w[redacted]d Supervision of high risk pregnancy, antepartum Discussed GBS/GC/CT next appt  HIV disease affecting pregnancy, antepartum Pt was not alone during telephone encounter, unable  to discuss.   Preterm labor symptoms and general obstetric precautions including but not limited to vaginal bleeding, contractions, leaking of fluid and fetal movement were reviewed in detail with the patient.   I discussed the assessment and treatment plan with the patient. The patient was provided an opportunity to ask questions and all were answered. The patient agreed with the plan and demonstrated an understanding of the instructions. The patient was advised to call back or seek an in-person office evaluation/go to [facility]U at [facility] for any urgent or concerning symptoms.  Please refer to After Visit Summary for other counseling recommendations.   I provided 15 minutes of non-face-to-face time during this encounter.  Future Appointments  Date Time Provider [place]  05/01/2022  8:30 AM [facility] NURSE [facility]  05/01/2022  8:45 AM [facility] US4 [facility]  05/09/2022 11:15 AM Leftwich-Kirby, Kathie Dike, CNM [facility] None   Inez Catalina, MD Center for [employer], [place]

## 2022-05-01 ENCOUNTER — Ambulatory Visit: Payer: 59 | Attending: Obstetrics and Gynecology

## 2022-05-01 ENCOUNTER — Ambulatory Visit: Payer: 59 | Admitting: *Deleted

## 2022-05-01 VITALS — BP 111/66 | HR 102

## 2022-05-01 DIAGNOSIS — Z3A35 35 weeks gestation of pregnancy: Secondary | ICD-10-CM

## 2022-05-01 DIAGNOSIS — O0933 Supervision of pregnancy with insufficient antenatal care, third trimester: Secondary | ICD-10-CM | POA: Diagnosis present

## 2022-05-01 DIAGNOSIS — O98512 Other viral diseases complicating pregnancy, second trimester: Secondary | ICD-10-CM

## 2022-05-01 DIAGNOSIS — O0990 Supervision of high risk pregnancy, unspecified, unspecified trimester: Secondary | ICD-10-CM | POA: Insufficient documentation

## 2022-05-01 DIAGNOSIS — O98713 Human immunodeficiency virus [HIV] disease complicating pregnancy, third trimester: Secondary | ICD-10-CM | POA: Insufficient documentation

## 2022-05-01 DIAGNOSIS — B20 Human immunodeficiency virus [HIV] disease: Secondary | ICD-10-CM

## 2022-05-09 ENCOUNTER — Ambulatory Visit (INDEPENDENT_AMBULATORY_CARE_PROVIDER_SITE_OTHER): Payer: 59 | Admitting: Advanced Practice Midwife

## 2022-05-09 ENCOUNTER — Other Ambulatory Visit (HOSPITAL_COMMUNITY)
Admission: RE | Admit: 2022-05-09 | Discharge: 2022-05-09 | Disposition: A | Discharge status: Home / Self Care | Payer: 59 | Source: Ambulatory Visit | Attending: Advanced Practice Midwife | Admitting: Advanced Practice Midwife

## 2022-05-09 VITALS — BP 113/70 | HR 78 | Wt 201.4 lb

## 2022-05-09 DIAGNOSIS — O0993 Supervision of high risk pregnancy, unspecified, third trimester: Secondary | ICD-10-CM

## 2022-05-09 DIAGNOSIS — Z3A36 36 weeks gestation of pregnancy: Secondary | ICD-10-CM

## 2022-05-09 DIAGNOSIS — O0990 Supervision of high risk pregnancy, unspecified, unspecified trimester: Secondary | ICD-10-CM | POA: Insufficient documentation

## 2022-05-09 DIAGNOSIS — O98713 Human immunodeficiency virus [HIV] disease complicating pregnancy, third trimester: Secondary | ICD-10-CM

## 2022-05-09 DIAGNOSIS — O98719 Human immunodeficiency virus [HIV] disease complicating pregnancy, unspecified trimester: Secondary | ICD-10-CM

## 2022-05-09 NOTE — Progress Notes (Addendum)
   PRENATAL VISIT NOTE  Subjective:  Jeraldine Clendenning is a 27 y.o. G2P1001 at 51w5dbeing seen today for ongoing prenatal care.  She is currently monitored for the following issues for this high-risk pregnancy and has Supervision of high risk pregnancy, antepartum; HIV disease affecting pregnancy; Late prenatal care affecting pregnancy; and Rubella non-immune status, antepartum on their problem list.  Patient reports occasional contractions.  Contractions: Irritability. Vag. Bleeding: None.  Movement: Present. Denies leaking of fluid.   The following portions of the patient's history were reviewed and updated as appropriate: allergies, current medications, past family history, past medical history, past social history, past surgical history and problem list.   Objective:   Vitals:   05/09/22 1130  BP: 113/70  Pulse: 78  Weight: 201 lb 6.4 oz (91.4 kg)    Fetal Status: Fetal Heart Rate (bpm): 131 Fundal Height: 36 cm Movement: Present  Presentation: Vertex  General:  Alert, oriented and cooperative. Patient is in no acute distress.  Skin: Skin is warm and dry. No rash noted.   Cardiovascular: Normal heart rate noted  Respiratory: Normal respiratory effort, no problems with respiration noted  Abdomen: Soft, gravid, appropriate for gestational age.  Pain/Pressure: Absent     Pelvic: Cervical exam performed in the presence of a chaperone Dilation: Fingertip Effacement (%): 50 Station: -3  Extremities: Normal range of motion.  Edema: None  Mental Status: Normal mood and affect. Normal behavior. Normal judgment and thought content.   Assessment and Plan:  Pregnancy: G2P1001 at 320w5d. Supervision of high risk pregnancy, antepartum --Anticipatory guidance about next visits/weeks of pregnancy given.   - Cervicovaginal ancillary only - Strep Gp B NAA  2. HIV disease affecting pregnancy, antepartum --Undetectable viral load (RNA quant <20) 12/27/21. Repeat RNA quant on 04/05/22 was 51.6.   Consult Dr ShDonalee CitrinMFM, who reviewed RNA quant and reports that this is still very low value, plan for vaginal delivery with intrapartum AZT.   3. [redacted] weeks gestation of pregnancy   Term labor symptoms and general obstetric precautions including but not limited to vaginal bleeding, contractions, leaking of fluid and fetal movement were reviewed in detail with the patient. Please refer to After Visit Summary for other counseling recommendations.   Return in about 1 week (around 05/16/2022) for HROB, Any provider.  Future Appointments  Date Time Provider De[place]3/07/2022 11:15 AM Constant, PeVickii ChafeMD CWGoodyearone    LiFatima BlankCNM

## 2022-05-09 NOTE — Progress Notes (Signed)
Pt presents for ROB visit. No concerns at this time.

## 2022-05-10 LAB — CERVICOVAGINAL ANCILLARY ONLY
Chlamydia: NEGATIVE
Comment: NEGATIVE
Comment: NEGATIVE
Comment: NORMAL
Neisseria Gonorrhea: NEGATIVE
Trichomonas: NEGATIVE

## 2022-05-11 LAB — STREP GP B NAA: Strep Gp B NAA: POSITIVE — AB

## 2022-05-12 ENCOUNTER — Telehealth: Payer: Self-pay

## 2022-05-12 ENCOUNTER — Encounter (HOSPITAL_BASED_OUTPATIENT_CLINIC_OR_DEPARTMENT_OTHER): Payer: Self-pay | Admitting: Advanced Practice Midwife

## 2022-05-12 DIAGNOSIS — O99820 Streptococcus B carrier state complicating pregnancy: Secondary | ICD-10-CM | POA: Insufficient documentation

## 2022-05-12 NOTE — Telephone Encounter (Signed)
Called patient to discuss FMLA paperwork.  No answer. Left message for patient to return call to the office.

## 2022-05-16 ENCOUNTER — Ambulatory Visit (INDEPENDENT_AMBULATORY_CARE_PROVIDER_SITE_OTHER): Payer: 59 | Admitting: Obstetrics and Gynecology

## 2022-05-16 ENCOUNTER — Encounter: Payer: Self-pay | Admitting: Obstetrics and Gynecology

## 2022-05-16 ENCOUNTER — Ambulatory Visit (INDEPENDENT_AMBULATORY_CARE_PROVIDER_SITE_OTHER): Payer: 59 | Admitting: Clinical

## 2022-05-16 VITALS — BP 123/84 | HR 99 | Wt 197.0 lb

## 2022-05-16 DIAGNOSIS — T7491XA Unspecified adult maltreatment, confirmed, initial encounter: Secondary | ICD-10-CM

## 2022-05-16 DIAGNOSIS — Z2839 Other underimmunization status: Secondary | ICD-10-CM

## 2022-05-16 DIAGNOSIS — O0990 Supervision of high risk pregnancy, unspecified, unspecified trimester: Secondary | ICD-10-CM

## 2022-05-16 DIAGNOSIS — O09893 Supervision of other high risk pregnancies, third trimester: Secondary | ICD-10-CM

## 2022-05-16 DIAGNOSIS — F430 Acute stress reaction: Secondary | ICD-10-CM

## 2022-05-16 DIAGNOSIS — O98713 Human immunodeficiency virus [HIV] disease complicating pregnancy, third trimester: Secondary | ICD-10-CM

## 2022-05-16 DIAGNOSIS — O0993 Supervision of high risk pregnancy, unspecified, third trimester: Secondary | ICD-10-CM

## 2022-05-16 DIAGNOSIS — O99820 Streptococcus B carrier state complicating pregnancy: Secondary | ICD-10-CM

## 2022-05-16 DIAGNOSIS — Z3A37 37 weeks gestation of pregnancy: Secondary | ICD-10-CM
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
## 2022-05-16 NOTE — Patient Instructions (Addendum)
Center for [facility] at [facility] [place], [place] [address] [phone] (main office) [phone] ([place] office)  [employer] [place] [email or web address] [phone]  [facility] [email or web address], [place], [place] [address] [phone] [email or web address]  [facility]:  [address], 2nd floor, [place], [place] [address] [phone])  Main line [phone]  [place] location **Parking is available in the [address] parking deck.  [place]:  [address] [place], [place] [address] [phone]) Main line [phone]  [place] location  **Located on the backside of the [place]. Limited parking is available off of [address]  Patric Dykes hours: Monday -Friday 8:30am-4:30pm  [email or web address]    If immediate emergency, call 9-1-1, or [facility] [place] 24 hour crisis hotline at: [phone]
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
## 2022-05-17 ENCOUNTER — Telehealth: Payer: Self-pay

## 2022-05-17 NOTE — Telephone Encounter (Signed)
Called to discuss paperwork, no answer, left vm

## 2022-05-23 ENCOUNTER — Ambulatory Visit (INDEPENDENT_AMBULATORY_CARE_PROVIDER_SITE_OTHER): Payer: 59 | Admitting: Obstetrics and Gynecology

## 2022-05-23 ENCOUNTER — Encounter: Payer: Self-pay | Admitting: Obstetrics and Gynecology

## 2022-05-23 ENCOUNTER — Encounter (HOSPITAL_COMMUNITY): Payer: Self-pay

## 2022-05-23 ENCOUNTER — Ambulatory Visit (INDEPENDENT_AMBULATORY_CARE_PROVIDER_SITE_OTHER): Payer: 59 | Admitting: Licensed Clinical Social Worker

## 2022-05-23 ENCOUNTER — Telehealth (HOSPITAL_COMMUNITY): Payer: Self-pay | Admitting: *Deleted

## 2022-05-23 VITALS — BP 113/73 | HR 79 | Wt 199.0 lb

## 2022-05-23 DIAGNOSIS — O98713 Human immunodeficiency virus [HIV] disease complicating pregnancy, third trimester: Secondary | ICD-10-CM

## 2022-05-23 DIAGNOSIS — O0993 Supervision of high risk pregnancy, unspecified, third trimester: Secondary | ICD-10-CM

## 2022-05-23 DIAGNOSIS — Z91414 Personal history of adult intimate partner abuse: Secondary | ICD-10-CM | POA: Diagnosis not present

## 2022-05-23 DIAGNOSIS — O479 False labor, unspecified: Secondary | ICD-10-CM

## 2022-05-23 DIAGNOSIS — Z6911 Encounter for mental health services for victim of spousal or partner abuse: Secondary | ICD-10-CM | POA: Diagnosis not present

## 2022-05-23 DIAGNOSIS — Z3A38 38 weeks gestation of pregnancy: Secondary | ICD-10-CM

## 2022-05-23 DIAGNOSIS — Z3490 Encounter for supervision of normal pregnancy, unspecified, unspecified trimester: Secondary | ICD-10-CM

## 2022-05-23 DIAGNOSIS — O09893 Supervision of other high risk pregnancies, third trimester: Secondary | ICD-10-CM

## 2022-05-23 DIAGNOSIS — O0990 Supervision of high risk pregnancy, unspecified, unspecified trimester: Secondary | ICD-10-CM

## 2022-05-23 DIAGNOSIS — O99820 Streptococcus B carrier state complicating pregnancy: Secondary | ICD-10-CM

## 2022-05-23 DIAGNOSIS — Z2839 Other underimmunization status: Secondary | ICD-10-CM

## 2022-05-23 DIAGNOSIS — O09899 Supervision of other high risk pregnancies, unspecified trimester: Secondary | ICD-10-CM

## 2022-05-23 DIAGNOSIS — O0933 Supervision of pregnancy with insufficient antenatal care, third trimester: Secondary | ICD-10-CM

## 2022-05-23 NOTE — Progress Notes (Signed)
   PRENATAL VISIT NOTE  Subjective:  Sheena Wallace is a 27 y.o. G2P1001 at [redacted]w[redacted]d being seen today for ongoing prenatal care.  She is currently monitored for the following issues for this high-risk pregnancy and has Supervision of high risk pregnancy, antepartum; HIV disease affecting pregnancy; Late prenatal care affecting pregnancy; Rubella non-immune status, antepartum; and GBS (group B Streptococcus carrier), +RV culture, currently pregnant on their problem list.  Patient reports  nonpainful but frequent contractions and an episode of vaginal discharge x 3 yesterday. No current leaking, does not think her water broke. .  Contractions: Irregular. Vag. Bleeding: None.  Movement: Present. Denies leaking of fluid.   The following portions of the patient's history were reviewed and updated as appropriate: allergies, current medications, past family history, past medical history, past social history, past surgical history and problem list.   Objective:   Vitals:   05/23/22 1045  BP: 113/73  Pulse: 79  Weight: 199 lb (90.3 kg)   Fetal Status: Fetal Heart Rate (bpm): 145 Fundal Height: 38 cm Movement: Present     General:  Alert, oriented and cooperative. Patient is in no acute distress.  Skin: Skin is warm and dry. No rash noted.   Cardiovascular: Normal heart rate noted  Respiratory: Normal respiratory effort, no problems with respiration noted  Abdomen: Soft, gravid, appropriate for gestational age.  Pain/Pressure: Present     Pelvic: Performed in presence of chaperone. NEFG. Cervix visually closed, no pooling or LOF. Thin white physiologic appearing discharge in vaginal vault. SCE cl/l/h  Assessment and Plan:  Pregnancy: G2P1001 at [redacted]w[redacted]d 1. Supervision of high risk pregnancy, antepartum 2. [redacted] weeks gestation of pregnancy 5. Braxton Hicks contractions No evidence of ROM or labor on exam today. Reviewed labor precautions. IOL ordered for 39 weeks after discussion with patient. Offered  by MFM for HIV w/ VL < 1000 (see note 02/27/22)  3. HIV disease affecting pregnancy in third trimester Last VL 1/24 51. Pt reports appt for repeat VL tomorrow Adherent with ART  4. Late prenatal care affecting pregnancy in third trimester  6. Rubella non-immune status, antepartum  7. GBS (group B Streptococcus carrier), +RV culture, currently pregnant PCN in labor  Future Appointments  Date Time Provider [place]  05/30/2022  1:30 PM Sheena Wallace, CNM [facility] None  06/06/2022  1:50 PM Sheena Wallace, Sheena Safe, MD [place] None   Inez Catalina, MD

## 2022-05-23 NOTE — Telephone Encounter (Signed)
Preadmission screen  

## 2022-05-23 NOTE — Progress Notes (Signed)
.  Pt presents for ROB visit. Pt reports leaking fluid x3 yesterday. Pt states underwear was wet and she had contractions all night. Requesting cervical check today.

## 2022-05-24 ENCOUNTER — Telehealth (HOSPITAL_COMMUNITY): Payer: Self-pay | Admitting: *Deleted

## 2022-05-24 ENCOUNTER — Other Ambulatory Visit: Payer: Self-pay | Admitting: Advanced Practice Midwife

## 2022-05-24 DIAGNOSIS — O0990 Supervision of high risk pregnancy, unspecified, unspecified trimester: Secondary | ICD-10-CM

## 2022-05-24 NOTE — Telephone Encounter (Signed)
Preadmission screen  
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
## 2022-05-25 ENCOUNTER — Telehealth (HOSPITAL_COMMUNITY): Payer: Self-pay | Admitting: *Deleted

## 2022-05-25 NOTE — Telephone Encounter (Signed)
Preadmission screen  

## 2022-05-25 NOTE — Progress Notes (Signed)
IOL orders entered 

## 2022-05-26 ENCOUNTER — Telehealth (HOSPITAL_COMMUNITY): Payer: Self-pay | Admitting: *Deleted

## 2022-05-26 NOTE — Telephone Encounter (Signed)
Preadmission screen  

## 2022-05-29 ENCOUNTER — Inpatient Hospital Stay (HOSPITAL_COMMUNITY)
Admission: RE | Admit: 2022-05-29 | Discharge: 2022-05-31 | DRG: 807 | Disposition: A | Discharge status: Home / Self Care | Payer: 59 | Attending: Obstetrics and Gynecology | Admitting: Obstetrics and Gynecology

## 2022-05-29 ENCOUNTER — Other Ambulatory Visit: Payer: Self-pay

## 2022-05-29 ENCOUNTER — Inpatient Hospital Stay (HOSPITAL_COMMUNITY): Payer: 59

## 2022-05-29 ENCOUNTER — Encounter (HOSPITAL_COMMUNITY): Payer: Self-pay | Admitting: Obstetrics and Gynecology

## 2022-05-29 DIAGNOSIS — Z21 Asymptomatic human immunodeficiency virus [HIV] infection status: Secondary | ICD-10-CM | POA: Diagnosis present

## 2022-05-29 DIAGNOSIS — Z830 Family history of human immunodeficiency virus [HIV] disease: Secondary | ICD-10-CM

## 2022-05-29 DIAGNOSIS — O43123 Velamentous insertion of umbilical cord, third trimester: Secondary | ICD-10-CM | POA: Diagnosis present

## 2022-05-29 DIAGNOSIS — O0990 Supervision of high risk pregnancy, unspecified, unspecified trimester: Secondary | ICD-10-CM

## 2022-05-29 DIAGNOSIS — B20 Human immunodeficiency virus [HIV] disease: Secondary | ICD-10-CM | POA: Diagnosis present

## 2022-05-29 DIAGNOSIS — O98713 Human immunodeficiency virus [HIV] disease complicating pregnancy, third trimester: Principal | ICD-10-CM

## 2022-05-29 DIAGNOSIS — O99824 Streptococcus B carrier state complicating childbirth: Secondary | ICD-10-CM | POA: Diagnosis present

## 2022-05-29 DIAGNOSIS — O0933 Supervision of pregnancy with insufficient antenatal care, third trimester: Secondary | ICD-10-CM

## 2022-05-29 DIAGNOSIS — O98719 Human immunodeficiency virus [HIV] disease complicating pregnancy, unspecified trimester: Secondary | ICD-10-CM | POA: Diagnosis present

## 2022-05-29 DIAGNOSIS — O9872 Human immunodeficiency virus [HIV] disease complicating childbirth: Secondary | ICD-10-CM | POA: Diagnosis present

## 2022-05-29 DIAGNOSIS — Z3A39 39 weeks gestation of pregnancy: Secondary | ICD-10-CM

## 2022-05-29 DIAGNOSIS — Z23 Encounter for immunization: Secondary | ICD-10-CM

## 2022-05-29 DIAGNOSIS — O99820 Streptococcus B carrier state complicating pregnancy: Secondary | ICD-10-CM

## 2022-05-29 DIAGNOSIS — Z3490 Encounter for supervision of normal pregnancy, unspecified, unspecified trimester: Secondary | ICD-10-CM | POA: Diagnosis present

## 2022-05-29 DIAGNOSIS — O26893 Other specified pregnancy related conditions, third trimester: Secondary | ICD-10-CM | POA: Diagnosis present

## 2022-05-29 DIAGNOSIS — O09899 Supervision of other high risk pregnancies, unspecified trimester: Secondary | ICD-10-CM

## 2022-05-29 DIAGNOSIS — Z2839 Other underimmunization status: Secondary | ICD-10-CM

## 2022-05-29 DIAGNOSIS — O4202 Full-term premature rupture of membranes, onset of labor within 24 hours of rupture: Secondary | ICD-10-CM

## 2022-05-29 LAB — CBC
HCT: 34 % — ABNORMAL LOW (ref 36.0–46.0)
Hemoglobin: 11.5 g/dL — ABNORMAL LOW (ref 12.0–15.0)
MCH: 33.6 pg (ref 26.0–34.0)
MCHC: 33.8 g/dL (ref 30.0–36.0)
MCV: 99.4 fL (ref 80.0–100.0)
Platelets: 193 10*3/uL (ref 150–400)
RBC: 3.42 MIL/uL — ABNORMAL LOW (ref 3.87–5.11)
RDW: 12.5 % (ref 11.5–15.5)
WBC: 5.7 10*3/uL (ref 4.0–10.5)
nRBC: 0 % (ref 0.0–0.2)

## 2022-05-29 LAB — TYPE AND SCREEN
ABO/RH(D): A POS
Antibody Screen: NEGATIVE

## 2022-05-29 MED ORDER — IBUPROFEN 600 MG PO TABS
600.0000 mg | ORAL_TABLET | Freq: Four times a day (QID) | ORAL | Status: DC
  Administered 2022-05-29 – 2022-05-31 (×6): 600 mg via ORAL
  Filled 2022-05-29 (×7): qty 1

## 2022-05-29 MED ORDER — SENNOSIDES-DOCUSATE SODIUM 8.6-50 MG PO TABS
2.0000 | ORAL_TABLET | Freq: Every day | ORAL | Status: DC
  Administered 2022-05-30 – 2022-05-31 (×2): 2 via ORAL
  Filled 2022-05-29 (×2): qty 2

## 2022-05-29 MED ORDER — ONDANSETRON HCL 4 MG PO TABS: 4.0000 mg | ORAL_TABLET | ORAL | Status: DC | PRN

## 2022-05-29 MED ORDER — WITCH HAZEL-GLYCERIN EX PADS: 1.0000 | MEDICATED_PAD | CUTANEOUS | Status: DC | PRN

## 2022-05-29 MED ORDER — SODIUM CHLORIDE 0.9 % IV SOLN
5.0000 10*6.[IU] | Freq: Once | INTRAVENOUS | Status: AC
  Administered 2022-05-29: 5 10*6.[IU] via INTRAVENOUS
  Filled 2022-05-29: qty 5

## 2022-05-29 MED ORDER — OXYTOCIN-SODIUM CHLORIDE 30-0.9 UT/500ML-% IV SOLN
2.5000 [IU]/h | INTRAVENOUS | Status: DC
  Administered 2022-05-29: 2.5 [IU]/h via INTRAVENOUS
  Filled 2022-05-29: qty 500

## 2022-05-29 MED ORDER — PRENATAL MULTIVITAMIN CH
1.0000 | ORAL_TABLET | Freq: Every day | ORAL | Status: DC
  Administered 2022-05-30 – 2022-05-31 (×2): 1 via ORAL
  Filled 2022-05-29 (×2): qty 1

## 2022-05-29 MED ORDER — ONDANSETRON HCL 4 MG/2ML IJ SOLN: 4.0000 mg | INTRAMUSCULAR | Status: DC | PRN

## 2022-05-29 MED ORDER — BENZOCAINE-MENTHOL 20-0.5 % EX AERO: 1.0000 | INHALATION_SPRAY | CUTANEOUS | Status: DC | PRN

## 2022-05-29 MED ORDER — OXYCODONE-ACETAMINOPHEN 5-325 MG PO TABS: 2.0000 | ORAL_TABLET | ORAL | Status: DC | PRN

## 2022-05-29 MED ORDER — OXYCODONE-ACETAMINOPHEN 5-325 MG PO TABS
1.0000 | ORAL_TABLET | ORAL | Status: DC | PRN
  Administered 2022-05-29: 1 via ORAL
  Filled 2022-05-29: qty 1

## 2022-05-29 MED ORDER — FENTANYL CITRATE (PF) 100 MCG/2ML IJ SOLN: 50.0000 ug | INTRAMUSCULAR | Status: DC | PRN

## 2022-05-29 MED ORDER — DIBUCAINE (PERIANAL) 1 % EX OINT: 1.0000 | TOPICAL_OINTMENT | CUTANEOUS | Status: DC | PRN

## 2022-05-29 MED ORDER — ZIDOVUDINE 10 MG/ML IV SOLN
1.0000 mg/kg/h | INTRAVENOUS | Status: DC
  Administered 2022-05-29: 1 mg/kg/h via INTRAVENOUS
  Filled 2022-05-29: qty 40

## 2022-05-29 MED ORDER — ACETAMINOPHEN 325 MG PO TABS: 650.0000 mg | ORAL_TABLET | ORAL | Status: DC | PRN

## 2022-05-29 MED ORDER — SOD CITRATE-CITRIC ACID 500-334 MG/5ML PO SOLN: 30.0000 mL | ORAL | Status: DC | PRN

## 2022-05-29 MED ORDER — ONDANSETRON HCL 4 MG/2ML IJ SOLN: 4.0000 mg | Freq: Four times a day (QID) | INTRAMUSCULAR | Status: DC | PRN

## 2022-05-29 MED ORDER — TERBUTALINE SULFATE 1 MG/ML IJ SOLN: 0.2500 mg | Freq: Once | INTRAMUSCULAR | Status: DC | PRN

## 2022-05-29 MED ORDER — PENICILLIN G POT IN DEXTROSE 60000 UNIT/ML IV SOLN
3.0000 10*6.[IU] | INTRAVENOUS | Status: DC
  Administered 2022-05-29: 3 10*6.[IU] via INTRAVENOUS
  Filled 2022-05-29 (×4): qty 50

## 2022-05-29 MED ORDER — COCONUT OIL OIL: 1.0000 | TOPICAL_OIL | Status: DC | PRN

## 2022-05-29 MED ORDER — LIDOCAINE HCL (PF) 1 % IJ SOLN
30.0000 mL | INTRAMUSCULAR | Status: DC | PRN
  Filled 2022-05-29: qty 30

## 2022-05-29 MED ORDER — LACTATED RINGERS IV SOLN: INTRAVENOUS | Status: DC

## 2022-05-29 MED ORDER — MISOPROSTOL 50MCG HALF TABLET
50.0000 ug | ORAL_TABLET | Freq: Once | ORAL | Status: AC
  Administered 2022-05-29: 50 ug via ORAL
  Filled 2022-05-29: qty 1

## 2022-05-29 MED ORDER — OXYTOCIN BOLUS FROM INFUSION
333.0000 mL | Freq: Once | INTRAVENOUS | Status: AC
  Administered 2022-05-29: 333 mL via INTRAVENOUS

## 2022-05-29 MED ORDER — DIPHENHYDRAMINE HCL 25 MG PO CAPS: 25.0000 mg | ORAL_CAPSULE | Freq: Four times a day (QID) | ORAL | Status: DC | PRN

## 2022-05-29 MED ORDER — ZOLPIDEM TARTRATE 5 MG PO TABS: 5.0000 mg | ORAL_TABLET | Freq: Every evening | ORAL | Status: DC | PRN

## 2022-05-29 MED ORDER — MISOPROSTOL 25 MCG QUARTER TABLET
25.0000 ug | ORAL_TABLET | Freq: Once | ORAL | Status: AC
  Administered 2022-05-29: 25 ug via VAGINAL
  Filled 2022-05-29: qty 1

## 2022-05-29 MED ORDER — LACTATED RINGERS IV SOLN: 500.0000 mL | INTRAVENOUS | Status: DC | PRN

## 2022-05-29 MED ORDER — ACETAMINOPHEN 325 MG PO TABS
650.0000 mg | ORAL_TABLET | ORAL | Status: DC | PRN
  Administered 2022-05-29 – 2022-05-30 (×2): 650 mg via ORAL
  Filled 2022-05-29 (×2): qty 2

## 2022-05-29 MED ORDER — ZIDOVUDINE 10 MG/ML IV SOLN
2.0000 mg/kg | Freq: Once | INTRAVENOUS | Status: AC
  Administered 2022-05-29: 181 mg via INTRAVENOUS
  Filled 2022-05-29: qty 18.1

## 2022-05-29 MED ORDER — ABACAVIR-DOLUTEGRAVIR-LAMIVUD 600-50-300 MG PO TABS
1.0000 | ORAL_TABLET | Freq: Every day | ORAL | Status: DC
  Administered 2022-05-30 – 2022-05-31 (×2): 1 via ORAL
  Filled 2022-05-29 (×2): qty 1

## 2022-05-29 MED ORDER — SIMETHICONE 80 MG PO CHEW: 80.0000 mg | CHEWABLE_TABLET | ORAL | Status: DC | PRN
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
## 2022-05-29 NOTE — H&P (Addendum)
OBSTETRIC ADMISSION HISTORY AND PHYSICAL  Sheena Wallace is a 27 y.o. female G2P1001 with IUP at [redacted]w[redacted]d by LMP presenting for IOL. She had some contractions before coming in, and had FM. No LOF, no VB, no blurry vision, headaches or peripheral edema, and RUQ pain.  She plans on bottle feeding. She declined birth control. She reports having an induction for her first child and had an epidural at that time. She is familiar with the process and has no questions.   She received her prenatal care at [facility].  Dating: By LMP --->  Estimated Date of Delivery: 06/01/22  Sono:    @[redacted]w[redacted]d , normal anatomy, cephalic presentation, posterior placenta, 31% EFW @[redacted]w[redacted]d , Cephalic, posterior placenta EFW 49%, AC 72%,   Prenatal History/Complications:  - HIV disease on ART, well controlled, last viral load 51.6 in 04/05/22 - rubella non-immune - GBS positive - late to prenatal care    Past Medical History: Past Medical History:  Diagnosis Date   HIV infection ([place])     Past Surgical History: Past Surgical History:  Procedure Laterality Date   WISDOM TOOTH EXTRACTION Bilateral     Obstetrical History: OB History  Gravida Para Term Preterm AB Living  2 1 1  0 0 1  SAB IAB Ectopic Multiple Live Births  0 0 0 0 1    # Outcome Date GA Lbr Len/2nd Weight Sex Delivery Anes PTL Lv  2 Current           1 Term 10/04/20    M Vag-Spont   LIV    Social History Social History   Socioeconomic History   Marital status: Single    Spouse name: Not on file   Number of children: Not on file   Years of education: Not on file   Highest education level: Not on file  Occupational History   Not on file  Tobacco Use   Smoking status: Never   Smokeless tobacco: Never  Vaping Use   Vaping Use: Never used  Substance and Sexual Activity   Alcohol use: No    Alcohol/week: 0.0 standard drinks of alcohol   Drug use: No   Sexual activity: Yes    Partners: Male    Birth control/protection: Condom    Comment:  accepted condoms  Other Topics Concern   Not on file  Social History Narrative   Not on file   Social Determinants of Health   Financial Resource Strain: Not on file  Food Insecurity: Not on file  Transportation Needs: Not on file  Physical Activity: Not on file  Stress: Not on file  Social Connections: Not on file    Family History: Family History  Problem Relation Age of Onset   HIV Father    HIV Mother     Allergies: No Known Allergies  Medications Prior to Admission  Medication Sig Dispense Refill Last Dose   abacavir-dolutegravir-lamiVUDine (TRIUMEQ) 600-50-300 MG tablet Take 1 tablet by mouth daily. 30 tablet 6    acetaminophen (TYLENOL) 325 MG tablet Take by mouth.      Blood Pressure Monitoring (BLOOD PRESSURE KIT) DEVI 1 Device by Does not apply route once a week. 1 each 0    Prenatal Vit-Fe Fumarate-FA (PRENATAL VITAMIN AND MINERAL PO) Take 1 tablet by mouth daily. Nature Made        Review of Systems   All systems reviewed and negative except as stated in HPI  Blood pressure 119/80, pulse 94, temperature 98 F (36.7 C), temperature source Oral,  resp. rate 18, height 5\' 4"  (1.626 m), weight 90.3 kg, last menstrual period 08/25/2021, unknown if currently breastfeeding. General appearance: alert, in no acute distress Lungs: normal work of breathing Heart: normal rate Presentation: cephalic Fetal monitoring: baseline 140, mod variability, + accel, - decel Uterine activity:  q 3-4 mins  Dilation: 1 Effacement (%): 50 Station: -2 Exam by:: [place], rn   Prenatal labs: ABO, Rh: --/--/A POS (03/18 1540) Antibody: NEG (03/18 1540) Rubella: <0.90 (12/11 1605) RPR: Non Reactive (01/02 0815)  HBsAg: Negative (12/11 1605)  HIV: Preliminary Reactive (12/11 1605)  GBS: Positive/-- (02/27 1306)  2 hr GTT 71/110/83 Genetic screening  NIPT low risk, [place] neg Anatomy US nl as above  Prenatal Transfer Tool  Maternal Diabetes: No Genetic Screening:  Normal Maternal Ultrasounds/Referrals: Normal Fetal Ultrasounds or other Referrals:  None Maternal Substance Abuse:  No Significant Maternal Medications:  abacavir-dolutegravir-lamivudine  Significant Maternal Lab Results:  Group B Strep positive, HIV positive Number of Prenatal Visits:greater than 3 verified prenatal visits Other Comments:  None  Results for orders placed or performed during the hospital encounter of 05/29/22 (from the past 24 hour(s))  CBC   Collection Time: 05/29/22  3:32 PM  Result Value Ref Range   WBC 5.7 4.0 - 10.5 K/uL   RBC 3.42 (L) 3.87 - 5.11 MIL/uL   Hemoglobin 11.5 (L) 12.0 - 15.0 g/dL   HCT 34.0 (L) 36.0 - 46.0 %   MCV 99.4 80.0 - 100.0 fL   MCH 33.6 26.0 - 34.0 pg   MCHC 33.8 30.0 - 36.0 g/dL   RDW 12.5 11.5 - 15.5 %   Platelets 193 150 - 400 K/uL   nRBC 0.0 0.0 - 0.2 %  Type and screen   Collection Time: 05/29/22  3:40 PM  Result Value Ref Range   ABO/RH(D) A POS    Antibody Screen NEG    Sample Expiration      06/01/2022,2359 Performed at [facility], [address], [place], [place] [address]     Patient Active Problem List   Diagnosis Date Noted   Encounter for induction of labor 05/29/2022   GBS (group B Streptococcus carrier), +RV culture, currently pregnant 05/12/2022   Rubella non-immune status, antepartum 03/14/2022   Supervision of high risk pregnancy, antepartum 01/02/2022   HIV disease affecting pregnancy 01/02/2022   Late prenatal care affecting pregnancy 01/02/2022    Assessment/Plan:  Sheena Wallace is a 27 y.o. G2P1001 at [redacted]w[redacted]d here for IOL  #HIV disease - most recent HIV quant was 51.6 in 04/05/2022 - per ID note, she is safe for vaginal delivery and should receive AZT intrapartum ppx. Also saw MFM - see rec on 02/27/2022 - will start intrapartum AZT  #Rubella Non-immune - MMR postpartum  #Labor: s/p cytotec 50/25. Plan to recheck SVE in 4 hours to assess need for foley balloon.  #Pain: Desires  unmedicated #FWB: Cat 1 #ID:  GBS pos - PCN #MOF: bottle #MOC: undecided #Circ:  N/A  Seen and discussed with Marcille Buffy, CNM. Jiayu "Darlyne Russian, M.D. PGY-2 Family Medicine Visiting Resident Faculty Practice 05/29/2022 4:46 PM   Attestation of Supervision of Student:  I confirm that I have verified the information documented in the  resident's  note and that I have also personally reperformed the history, physical exam and all medical decision making activities.  I have verified that all services and findings are accurately documented in this student's note; and I agree with management and plan as outlined  in the documentation. I have also made any necessary editorial changes.  Marcille Buffy DNP, CNM  05/29/22  5:07 PM

## 2022-05-29 NOTE — Progress Notes (Signed)
Labor Progress Note  Sheena Wallace is a 27 y.o. G2P1001 at [redacted]w[redacted]d presented for IOL for HIV  S: feeling very uncomfortable  O:  BP 109/68   Pulse 90   Temp 98 F (36.7 C) (Oral)   Resp 16   Ht 5\' 4"  (1.626 m)   Wt 90.3 kg   LMP 08/25/2021   BMI 34.16 kg/m  EFM: 145bpm/Moderate variability/ none accels/ Early decels Toco q 2 min  CVE: Dilation: 6 Effacement (%): 90 Cervical Position: Middle Station: -1 Presentation: Vertex Exam by:: Evonnie Dawes, rn   A&P: 27 y.o. G2P1001 [redacted]w[redacted]d  here for IOL as above #HIV - AZT loading dose finished, on infusion now. (Started at 1730) #Labor: s/p cytotec 50PO / 25 Vag. Progressing very quickly. Defer augmentation until getting 4 hours of antiviral if possible.  #Pain: Family/Friend support. Discussed using Nitrous oxide, and PO/IV pain meds #FWB: CAT 1 #GBS positive on PCN (first dose 1600)  Seen with Sheena Wallace, CNM. [name] "Darlyne Russian, M.D. PGY-2 Family Medicine Visiting Resident Faculty Practice 05/29/2022 7:19 PM

## 2022-05-29 NOTE — Plan of Care (Signed)
  Problem: Education: Goal: Knowledge of Childbirth will improve Outcome: Adequate for Discharge Goal: Ability to make informed decisions regarding treatment and plan of care will improve Outcome: Adequate for Discharge Goal: Ability to state and carry out methods to decrease the pain will improve Outcome: Completed/Met   Problem: Coping: Goal: Ability to verbalize concerns and feelings about labor and delivery will improve Outcome: Completed/Met   Problem: Life Cycle: Goal: Ability to make normal progression through stages of labor will improve Outcome: Completed/Met Goal: Ability to effectively push during vaginal delivery will improve Outcome: Completed/Met   Problem: Role Relationship: Goal: Will demonstrate positive interactions with the child Outcome: Completed/Met   Problem: Safety: Goal: Risk of complications during labor and delivery will decrease Outcome: Completed/Met   Problem: Pain Management: Goal: Relief or control of pain from uterine contractions will improve Outcome: Adequate for Discharge

## 2022-05-30 LAB — HIV-1 RNA QUANT-NO REFLEX-BLD
HIV 1 RNA Quant: 50 {copies}/mL
LOG10 HIV-1 RNA: 1.699 {Log_copies}/mL

## 2022-05-30 LAB — RPR: RPR Ser Ql: NONREACTIVE

## 2022-05-30 MED ORDER — MEASLES, MUMPS & RUBELLA VAC IJ SOLR
0.5000 mL | Freq: Once | INTRAMUSCULAR | Status: AC
  Administered 2022-05-31: 0.5 mL via SUBCUTANEOUS
  Filled 2022-05-30: qty 0.5

## 2022-05-30 MED ORDER — ACETAMINOPHEN 500 MG PO TABS
1000.0000 mg | ORAL_TABLET | Freq: Three times a day (TID) | ORAL | Status: DC
  Administered 2022-05-30 (×2): 1000 mg via ORAL
  Filled 2022-05-30 (×3): qty 2
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
## 2022-05-30 NOTE — Progress Notes (Addendum)
POSTPARTUM PROGRESS NOTE  Post Partum Day 1  Subjective:  Sheena Wallace is a 27 y.o. [id number] s/p SVD after IOL for HIV at [redacted]w[redacted]d.  She reports she is doing well. No acute events overnight. She denies any problems with ambulating, voiding or po intake. Denies nausea or vomiting.  Pain is moderately controlled, she is having cramping like a bad period.  Lochia is not foul smelling, still passing some clots but smaller than egg, changing pad about every 4hrs.  Objective: Blood pressure 106/74, pulse 85, temperature 98.4 F (36.9 C), temperature source Oral, resp. rate 16, height 5\' 4"  (1.626 m), weight 90.3 kg, last menstrual period 08/25/2021, SpO2 100 %, unknown if currently breastfeeding.  Physical Exam:  General: alert, cooperative and no distress Chest: no respiratory distress Heart:regular rate, distal pulses intact Abdomen: soft, nontender,  Uterine Fundus: firm, appropriately tender DVT Evaluation: No calf swelling or tenderness Extremities: no edema Skin: warm, dry  Recent Labs    05/29/22 1532  HGB 11.5*  HCT 34.0*    Assessment/Plan: Sheena Wallace is a 27 y.o. [id number] s/p SVD after IOL for HIV at [redacted]w[redacted]d   PPD#1 - Doing well  Routine postpartum care  #HIV disease Most recent HIV quant was 51.6 in 04/05/2022. ID following. Delivered on AZT. - HIV quant pending - continue home antiviral therapy  #Rubella non-immune - needs MMR  Contraception: none Feeding: Bottle feed Dispo: Plan for discharge, discharge when baby cleared from ID standpoint.   LOS: 1 day   Enid Baas, MD Family Medicine, PGY1   Fellow Attestation  I saw and evaluated the patient, performing the key elements of the service.I  personally performed or re-performed the history, physical exam, and medical decision making activities of this service and have verified that the service and findings are accurately documented in the resident's note. I developed the management plan that is  described in the resident's note, and I agree with the content, with my edits above.   PPD#1, doing well overall. Having significant cramping intermittently. Has scheduled ibuprofen on board. I have added scheduled tylenol.  Anticipate discharge home tomorrow.  Stormy Card, MD,[facility] OB Fellow, Faculty Practice  05/30/2022 12:45 PM

## 2022-05-31 MED ORDER — IBUPROFEN 600 MG PO TABS: 600.0000 mg | ORAL_TABLET | Freq: Four times a day (QID) | ORAL | 0 refills | Status: AC

## 2022-05-31 NOTE — Progress Notes (Signed)
POSTPARTUM PROGRESS NOTE  Post Partum Day 2  Subjective:  Sheena Wallace is a 27 y.o. [id number] s/p SVD after IOL for HIV at [redacted]w[redacted]d.  She reports she is doing well. No acute events overnight. She denies any problems with ambulating, voiding or po intake. Denies nausea or vomiting.  Pain is moderately controlled, she states that her cramping is less intense than yesterday. Lochia is mild.  Objective: Blood pressure (!) 109/59, pulse 69, temperature 98.1 F (36.7 C), temperature source Oral, resp. rate 18, height 5\' 4"  (1.626 m), weight 90.3 kg, last menstrual period 08/25/2021, SpO2 100 %, unknown if currently breastfeeding.  Physical Exam:  General: alert, cooperative and no distress Chest: no respiratory distress Heart:regular rate, distal pulses intact Abdomen: soft, nontender,  Uterine Fundus: firm, appropriately tender DVT Evaluation: No calf swelling or tenderness Extremities: no edema Skin: warm, dry  Recent Labs    05/29/22 1532  HGB 11.5*  HCT 34.0*     Assessment/Plan: Sheena Wallace is a 27 y.o. [id number] s/p SVD after IOL for HIV at [redacted]w[redacted]d   PPD#1 - Doing well  Routine postpartum care  #HIV disease Most recent HIV quant was 50 on 05/29/22. ID following. Delivered on AZT. - continue home antiviral therapy  #Rubella non-immune - needs MMR  Contraception: none Feeding: Bottle feed Dispo: Plan for discharge, discharge when baby cleared from ID.   LOS: 2 days   Sheena Baas, MD Family Medicine, PGY1

## 2022-06-06 ENCOUNTER — Encounter: Payer: 59 | Admitting: Family Medicine

## 2022-06-09 ENCOUNTER — Telehealth (HOSPITAL_COMMUNITY): Payer: Self-pay | Admitting: *Deleted

## 2022-06-09 NOTE — Telephone Encounter (Signed)
Left phone voicemail message.  Odis Hollingshead, RN 06-09-2022 at 2:59pm

## 2022-06-28 ENCOUNTER — Ambulatory Visit: Payer: 59 | Admitting: Family Medicine

## 2022-07-04 ENCOUNTER — Telehealth: Payer: Self-pay

## 2022-07-04 NOTE — Telephone Encounter (Signed)
Left Message - R/S PP ATTEMPT  

## 2022-08-31 ENCOUNTER — Ambulatory Visit (INDEPENDENT_AMBULATORY_CARE_PROVIDER_SITE_OTHER): Payer: 59 | Admitting: Obstetrics and Gynecology

## 2022-08-31 ENCOUNTER — Encounter: Payer: Self-pay | Admitting: Obstetrics

## 2022-08-31 ENCOUNTER — Other Ambulatory Visit (HOSPITAL_COMMUNITY)
Admission: RE | Admit: 2022-08-31 | Discharge: 2022-08-31 | Disposition: A | Discharge status: Home / Self Care | Payer: 59 | Source: Ambulatory Visit | Attending: Obstetrics and Gynecology | Admitting: Obstetrics and Gynecology

## 2022-08-31 VITALS — BP 111/84 | HR 65 | Wt 185.0 lb

## 2022-08-31 DIAGNOSIS — Z01419 Encounter for gynecological examination (general) (routine) without abnormal findings: Secondary | ICD-10-CM

## 2022-08-31 NOTE — Progress Notes (Signed)
Post Partum Visit Note/ Annual exam  Sheena Wallace is a 27 y.o. G16P2002 female who presents for a postpartum visit. She is  12  weeks postpartum following a normal spontaneous vaginal delivery.  I have fully reviewed the prenatal and intrapartum course. The delivery was at 39.4 gestational weeks.  Anesthesia: none. Postpartum course has been uncomplicated. Baby is doing well. Baby is feeding by bottle - Similac Sensitive RS. Bleeding no bleeding. Bowel function is normal. Bladder function is normal. Patient is sexually active. Contraception method is condoms.   Postpartum depression screening: negative, score 0.   The pregnancy intention screening data noted above was reviewed. Potential methods of contraception were discussed. The patient elected to proceed with condoms.   Edinburgh Postnatal Depression Scale - 08/31/22 0926       Edinburgh Postnatal Depression Scale:  In the Past 7 Days   I have been able to laugh and see the funny side of things. 0    I have looked forward with enjoyment to things. 0    I have blamed myself unnecessarily when things went wrong. 0    I have been anxious or worried for no good reason. 0    I have felt scared or panicky for no good reason. 0    Things have been getting on top of me. 0    I have been so unhappy that I have had difficulty sleeping. 0    I have felt sad or miserable. 0    I have been so unhappy that I have been crying. 0    The thought of harming myself has occurred to me. 0    Edinburgh Postnatal Depression Scale Total 0             Health Maintenance Due  Topic Date Due   COVID-19 Vaccine (1) Never done   PAP-Cervical Cytology Screening  09/23/2018   PAP SMEAR-Modifier  09/23/2018    The following portions of the patient's history were reviewed and updated as appropriate: allergies, current medications, past family history, past medical history, past social history, past surgical history, and problem list.  Review of  Systems Pertinent items are noted in HPI.  Objective:  BP 111/84   Pulse 65   Wt 185 lb (83.9 kg)   LMP 07/24/2022 (Approximate)   Breastfeeding No   BMI 31.76 kg/m    General:  alert, cooperative, and no distress   Breasts:  not indicated  Lungs: clear to auscultation bilaterally  Heart:  regular rate and rhythm  Abdomen: soft, non-tender; bowel sounds normal; no masses,  no organomegaly   Wound N/a  GU exam:  normal, pap taken without incident       Assessment:    Encounter for postpartum care normal postpartum exam.   Plan:   Essential components of care per ACOG recommendations:  1.  Mood and well being: Patient with negative depression screening today. Reviewed local resources for support.  - Patient tobacco use? No.   - hx of drug use? No.    2. Infant care and feeding:  -Patient currently breastmilk feeding? No.  -Social determinants of health (SDOH) reviewed in EPIC.   3. Sexuality, contraception and birth spacing - Patient does not want a pregnancy in the next year.  Desired family size is 2 children.  - Reviewed reproductive life planning. Reviewed contraceptive methods based on pt preferences and effectiveness.  Patient desired Female Condom today.   - Discussed birth spacing of 18 months  4. Sleep and fatigue -Encouraged family/partner/community support of 4 hrs of uninterrupted sleep to help with mood and fatigue  5. Physical Recovery  - Discussed patients delivery and complications. She describes her labor as good. - Patient had a Vaginal, no problems at delivery. Patient had a 1st degree laceration. Perineal healing reviewed. Patient expressed understanding - Patient has urinary incontinence? No. - Patient is safe to resume physical and sexual activity  6.  Health Maintenance - HM due items addressed Yes - Last pap smear No results found for: "DIAGPAP" Pap smear done at today's visit.  -Breast Cancer screening indicated? No.   7. Chronic  Disease/Pregnancy Condition follow up:  HIV Pt has transferred ID care to [facility]  - PCP follow up Pt seen greater than 8 weeks post delivery, will bill as annual exam F/u in 1 year Warden Fillers, MD [facility] [employer], [facility]

## 2022-09-01 ENCOUNTER — Encounter: Payer: Self-pay | Admitting: Obstetrics

## 2022-09-06 LAB — CYTOLOGY - PAP
Comment: NEGATIVE
Diagnosis: UNDETERMINED — AB
High risk HPV: NEGATIVE

## 2023-05-04 ENCOUNTER — Ambulatory Visit: Payer: 59 | Admitting: Obstetrics and Gynecology
